# Patient Record
Sex: Male | Born: 1960 | Hispanic: Yes | Marital: Married | Smoking: Former smoker
Health system: Southern US, Community
[De-identification: ages and names within clinical notes are randomized; demographics above are authoritative.]

## PROBLEM LIST (undated history)

## (undated) DIAGNOSIS — I2699 Other pulmonary embolism without acute cor pulmonale: Secondary | ICD-10-CM

## (undated) DIAGNOSIS — L0390 Cellulitis, unspecified: Secondary | ICD-10-CM

## (undated) DIAGNOSIS — R200 Anesthesia of skin: Secondary | ICD-10-CM

## (undated) DIAGNOSIS — I10 Essential (primary) hypertension: Secondary | ICD-10-CM

## (undated) DIAGNOSIS — A4902 Methicillin resistant Staphylococcus aureus infection, unspecified site: Secondary | ICD-10-CM

## (undated) DIAGNOSIS — J90 Pleural effusion, not elsewhere classified: Secondary | ICD-10-CM

## (undated) DIAGNOSIS — R002 Palpitations: Secondary | ICD-10-CM

## (undated) DIAGNOSIS — R0600 Dyspnea, unspecified: Secondary | ICD-10-CM

## (undated) DIAGNOSIS — Z720 Tobacco use: Secondary | ICD-10-CM

## (undated) DIAGNOSIS — A419 Sepsis, unspecified organism: Secondary | ICD-10-CM

## (undated) DIAGNOSIS — M549 Dorsalgia, unspecified: Secondary | ICD-10-CM

---
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
## 2008-06-22 ENCOUNTER — Emergency Department (HOSPITAL_COMMUNITY): Admission: EM | Admit: 2008-06-22 | Discharge: 2008-06-22 | Payer: Self-pay | Admitting: Emergency Medicine

## 2008-06-23 ENCOUNTER — Emergency Department (HOSPITAL_COMMUNITY): Admission: EM | Admit: 2008-06-23 | Discharge: 2008-06-23 | Payer: Self-pay | Admitting: Emergency Medicine

## 2009-04-25 ENCOUNTER — Emergency Department (HOSPITAL_COMMUNITY): Admission: EM | Admit: 2009-04-25 | Discharge: 2009-04-25 | Payer: Self-pay | Admitting: Emergency Medicine

## 2010-12-24 LAB — URINALYSIS, ROUTINE W REFLEX MICROSCOPIC
Leukocytes, UA: NEGATIVE
Nitrite: NEGATIVE
Protein, ur: 30 mg/dL — AB
Urobilinogen, UA: 0.2 mg/dL (ref 0.0–1.0)

## 2010-12-24 LAB — POCT I-STAT, CHEM 8
BUN: 15 mg/dL (ref 6–23)
Creatinine, Ser: 0.9 mg/dL (ref 0.4–1.5)
Potassium: 3.3 mEq/L — ABNORMAL LOW (ref 3.5–5.1)
Sodium: 142 mEq/L (ref 135–145)

## 2010-12-24 LAB — CBC
Hemoglobin: 14.8 g/dL (ref 13.0–17.0)
MCV: 90.7 fL (ref 78.0–100.0)
RBC: 4.62 MIL/uL (ref 4.22–5.81)
WBC: 7.2 10*3/uL (ref 4.0–10.5)

## 2010-12-24 LAB — DIFFERENTIAL
Eosinophils Absolute: 0.1 10*3/uL (ref 0.0–0.7)
Lymphs Abs: 2.1 10*3/uL (ref 0.7–4.0)
Monocytes Relative: 6 % (ref 3–12)
Neutro Abs: 4.5 10*3/uL (ref 1.7–7.7)
Neutrophils Relative %: 63 % (ref 43–77)

## 2010-12-24 LAB — POCT CARDIAC MARKERS
CKMB, poc: <1 ng/mL — ABNORMAL LOW (ref 1.0–8.0)
Myoglobin, poc: 111 ng/mL (ref 12–200)

## 2010-12-24 LAB — URINE MICROSCOPIC-ADD ON

## 2011-06-20 LAB — URINALYSIS, ROUTINE W REFLEX MICROSCOPIC
Bilirubin Urine: NEGATIVE
Glucose, UA: NEGATIVE
Hgb urine dipstick: NEGATIVE
Ketones, ur: NEGATIVE
Nitrite: NEGATIVE
Protein, ur: NEGATIVE
Specific Gravity, Urine: 1.029
Urobilinogen, UA: 0.2
pH: 6

## 2011-06-20 LAB — POCT I-STAT, CHEM 8
BUN: 20
Calcium, Ion: 1.26
Chloride: 103

## 2013-10-26 ENCOUNTER — Ambulatory Visit: Payer: Self-pay | Admitting: Family Medicine

## 2013-10-26 VITALS — BP 136/72 | HR 79 | Temp 97.9°F | Resp 16 | Ht 68.0 in | Wt 204.0 lb

## 2013-10-26 DIAGNOSIS — L0390 Cellulitis, unspecified: Principal | ICD-10-CM

## 2013-10-26 DIAGNOSIS — Z131 Encounter for screening for diabetes mellitus: Secondary | ICD-10-CM

## 2013-10-26 DIAGNOSIS — L0291 Cutaneous abscess, unspecified: Secondary | ICD-10-CM

## 2013-10-26 LAB — POCT CBC
Granulocyte percent: 85.8 %G — AB (ref 37–80)
HCT, POC: 47 % (ref 43.5–53.7)
Hemoglobin: 15 g/dL (ref 14.1–18.1)
Lymph, poc: 1.2 (ref 0.6–3.4)
MCH, POC: 30.4 pg (ref 27–31.2)
MCHC: 31.9 g/dL (ref 31.8–35.4)
MCV: 95.4 fL (ref 80–97)
MID (cbc): 0.6 (ref 0–0.9)
MPV: 9.5 fL (ref 0–99.8)
POC Granulocyte: 10.8 — AB (ref 2–6.9)
POC LYMPH PERCENT: 9.4 %L — AB (ref 10–50)
POC MID %: 4.8 % (ref 0–12)
Platelet Count, POC: 200 10*3/uL (ref 142–424)
RBC: 4.93 M/uL (ref 4.69–6.13)
RDW, POC: 13.3 %
WBC: 12.6 10*3/uL — AB (ref 4.6–10.2)

## 2013-10-26 LAB — POCT GLYCOSYLATED HEMOGLOBIN (HGB A1C): Hemoglobin A1C: 5.5

## 2013-10-26 MED ORDER — DOXYCYCLINE HYCLATE 100 MG PO TABS: 100.0000 mg | ORAL_TABLET | Freq: Two times a day (BID) | ORAL | Status: DC

## 2013-10-26 MED ORDER — CEFTRIAXONE SODIUM 1 G IJ SOLR
1.0000 g | Freq: Once | INTRAMUSCULAR | Status: AC
  Administered 2013-10-26: 1 g via INTRAMUSCULAR

## 2013-10-26 MED ORDER — OXYCODONE-ACETAMINOPHEN 5-325 MG PO TABS: 1.0000 | ORAL_TABLET | Freq: Three times a day (TID) | ORAL | Status: DC | PRN

## 2013-10-26 NOTE — Patient Instructions (Addendum)
Abscess An abscess is an infected area that contains a collection of pus and debris.It can occur in almost any part of the body. An abscess is also known as a furuncle or boil. CAUSES  An abscess occurs when tissue gets infected. This can occur from blockage of oil or sweat glands, infection of hair follicles, or a minor injury to the skin. As the body tries to fight the infection, pus collects in the area and creates pressure under the skin. This pressure causes pain. People with weakened immune systems have difficulty fighting infections and get certain abscesses more often.  SYMPTOMS Usually an abscess develops on the skin and becomes a painful mass that is red, warm, and tender. If the abscess forms under the skin, you may feel a moveable soft area under the skin. Some abscesses break open (rupture) on their own, but most will continue to get worse without care. The infection can spread deeper into the body and eventually into the bloodstream, causing you to feel ill.  DIAGNOSIS  Your caregiver will take your medical history and perform a physical exam. A sample of fluid may also be taken from the abscess to determine what is causing your infection. TREATMENT  Your caregiver may prescribe antibiotic medicines to fight the infection. However, taking antibiotics alone usually does not cure an abscess. Your caregiver may need to make a small cut (incision) in the abscess to drain the pus. In some cases, gauze is packed into the abscess to reduce pain and to continue draining the area. HOME CARE INSTRUCTIONS   Only take over-the-counter or prescription medicines for pain, discomfort, or fever as directed by your caregiver.  If you were prescribed antibiotics, take them as directed. Finish them even if you start to feel better.  If gauze is used, follow your caregiver's directions for changing the gauze.  To avoid spreading the infection:  Keep your draining abscess covered with a  bandage.  Wash your hands well.  Do not share personal care items, towels, or whirlpools with others.  Avoid skin contact with others.  Keep your skin and clothes clean around the abscess.  Keep all follow-up appointments as directed by your caregiver. SEEK MEDICAL CARE IF:   You have increased pain, swelling, redness, fluid drainage, or bleeding.  You have muscle aches, chills, or a general ill feeling.  You have a fever. MAKE SURE YOU:   Understand these instructions.  Will watch your condition.  Will get help right away if you are not doing well or get worse. Document Released: 06/14/2005 Document Revised: 03/05/2012 Document Reviewed: 11/17/2011 [facility] Patient Information 2014 [place]. Absceso (Abscess)  Un absceso (granoo fornculo) es una zona infectada sobre la piel o debajo de la misma. Esta zona se llena de un lquido blanco amarillento (pus) y Psychiatric nurse (residuos).  CUIDADOS EN EL HOGAR  Tome slo la medicacin que Mats Jeanlouis indic el mdico.  Si Sanayah Munro han recetado antibiticos, tmelos segn las indicaciones. Finalice el [place], aunque comience a Sports administrator.  Si Makia Bossi aplicaron una gasa, siga las indicaciones del mdico para [place].  Para evitar la propagacin de la infeccin:  Mantenga el absceso cubierto con el vendaje.  Lvese bien las manos.  No comparta artculos de cuidado personal, toallas o jacuzzis con otros.  Evite el contacto con la piel de Producer, television/film/video.  Mantenga la piel y la ropa limpia alrededor del absceso.  Cumpla con los controles mdicos segn las indicaciones. SOLICITE AYUDA DE INMEDIATO SI:  Elenore Rota  el dolor, el enrojecimiento o la Estate agent de la herida.  Observa lquido o sangre que proviene del sitio de la herida.  Tiene fiebre, escalofros o se siente enfermo.  Tiene fiebre. ASEGRESE DE QUE:   Comprende esas instrucciones para el alta mdica.  Controlar su enfermedad.  Solicitar ayuda  de inmediato si no mejora o empeora. Document Released: 12/01/2008 Document Revised: 03/05/2012 [facility] Patient Information 2014 [place], [place].

## 2013-10-26 NOTE — Progress Notes (Addendum)
Chief Complaint:  Chief Complaint  Patient presents with  . Abscess    to the left inner thigh x's 2 days    HPI: Guy Walker is a 53 y.o. male who is here for  1 week history of left groin cellulitis and groin pain, He initialy had a pimple and then it got worse so he popped it. He also had URI sxs earlier in the week  And was taking ibuprofen and cold and flu meds so if he had fevers or chills they were masked. The pain and skin infection got worse yesterday. He denies fevers at this time. He complains of mainly pain in the area. There was an area that was draining  where he tried to pop the pimple. He denies diabetes. No prior skin infection, has tried some PCN cream ? [employer]. No prior infections, he works in Architect  History reviewed. No pertinent past medical history. History reviewed. No pertinent past surgical history. History   Social History  . Marital Status: Married    Spouse Name: N/A    Number of Children: N/A  . Years of Education: N/A   Social History Main Topics  . Smoking status: Former Smoker    Types: Cigarettes    Quit date: 03/25/2013  . Smokeless tobacco: Never Used  . Alcohol Use: No  . Drug Use: No  . Sexual Activity: None   Other Topics Concern  . None   Social History Narrative  . None   History reviewed. No pertinent family history. No Known Allergies Prior to Admission medications   Not on File     ROS: The patient denies fevers, chills, night sweats, unintentional weight loss, chest pain, palpitations, wheezing, dyspnea on exertion, nausea, vomiting, abdominal pain, dysuria, hematuria, melena, numbness, weakness, or tingling.  All other systems have been reviewed and were otherwise negative with the exception of those mentioned in the HPI and as above.    PHYSICAL EXAM: Filed Vitals:   10/26/13 1324  BP: 136/72  Pulse: 79  Temp: 97.9 F (36.6 C)  Resp: 16   Filed Vitals:   10/26/13 1324  Height: 5'  8" (1.727 m)  Weight: 204 lb (92.534 kg)   Body mass index is 31.03 kg/(m^2).  General: Alert, no acute distress HEENT:  Normocephalic, atraumatic, oropharynx patent. EOMI, PERRLA Cardiovascular:  Regular rate and rhythm, no rubs murmurs or gallops.  No Carotid bruits, radial pulse intact. No pedal edema.  Respiratory: Clear to auscultation bilaterally.  No wheezes, rales, or rhonchi.  No cyanosis, no use of accessory musculature GI: No organomegaly, abdomen is soft and non-tender, positive bowel sounds.  No masses. Skin: + abscess and cellulitis left groin Neurologic: Facial musculature symmetric. Psychiatric: Patient is appropriate throughout our interaction. Lymphatic: No cervical lymphadenopathy Musculoskeletal: Gait intact.   LABS: Results for orders placed in visit on 10/26/13  POCT GLYCOSYLATED HEMOGLOBIN (HGB A1C)      Result Value Range   Hemoglobin A1C 5.5    POCT CBC      Result Value Range   WBC 12.6 (*) 4.6 - 10.2 K/uL   Lymph, poc 1.2  0.6 - 3.4   POC LYMPH PERCENT 9.4 (*) 10 - 50 %L   MID (cbc) 0.6  0 - 0.9   POC MID % 4.8  0 - 12 %M   POC Granulocyte 10.8 (*) 2 - 6.9   Granulocyte percent 85.8 (*) 37 - 80 %G   RBC 4.93  4.69 -  6.13 M/uL   Hemoglobin 15.0  14.1 - 18.1 g/dL   HCT, POC 47.0  43.5 - 53.7 %   MCV 95.4  80 - 97 fL   MCH, POC 30.4  27 - 31.2 pg   MCHC 31.9  31.8 - 35.4 g/dL   RDW, POC 13.3     Platelet Count, POC 200  142 - 424 K/uL   MPV 9.5  0 - 99.8 fL     EKG/XRAY:   Primary read interpreted by Dr. Marin Comment at [facility].   ASSESSMENT/PLAN: Encounter Diagnoses  Name Primary?  . Cellulitis and abscess Yes  . Screening for diabetes mellitus    Rocephin 500 mg x 2 fiven since we ran out of 1 gram Rocephin I& D some but not all Rx for Doxycycline was given Rx percocet prn for pain F/u in Am  Gross sideeffects, risk and benefits, and alternatives of medications d/w patient. Patient is aware that all medications have potential sideeffects and we  are unable to predict every sideeffect or drug-drug interaction that may occur.  LE, Harts, DO 10/26/2013 3:00 PM

## 2013-10-26 NOTE — Progress Notes (Signed)
Procedure Note: Verbal consent obtained.  Local anesthesia with 3 cc 2% lidocaine.  Betadine prep.  Incision with 11 blade.  Copious purulence and blood expressed.  Wound explored with curved hemostats.  Wound irrigated with remaining anesthetic.  Packed with 1/2 inch plain packing.  Cleansed and dressed.  Discussed wound care.  Pt to RTC tomorrow for recheck.

## 2013-10-27 ENCOUNTER — Inpatient Hospital Stay (HOSPITAL_COMMUNITY)
Admission: EM | Admit: 2013-10-27 | Discharge: 2013-11-02 | DRG: 872 | Disposition: A | Discharge status: Home / Self Care | Payer: Self-pay | Attending: Internal Medicine | Admitting: Internal Medicine

## 2013-10-27 ENCOUNTER — Encounter (HOSPITAL_COMMUNITY): Payer: Self-pay | Admitting: Emergency Medicine

## 2013-10-27 ENCOUNTER — Ambulatory Visit (INDEPENDENT_AMBULATORY_CARE_PROVIDER_SITE_OTHER): Payer: Self-pay | Admitting: Physician Assistant

## 2013-10-27 VITALS — BP 126/78 | HR 91 | Temp 98.4°F | Resp 17 | Ht 69.0 in | Wt 206.0 lb

## 2013-10-27 DIAGNOSIS — B9562 Methicillin resistant Staphylococcus aureus infection as the cause of diseases classified elsewhere: Secondary | ICD-10-CM

## 2013-10-27 DIAGNOSIS — A4902 Methicillin resistant Staphylococcus aureus infection, unspecified site: Secondary | ICD-10-CM | POA: Diagnosis present

## 2013-10-27 DIAGNOSIS — IMO0002 Reserved for concepts with insufficient information to code with codable children: Secondary | ICD-10-CM | POA: Diagnosis present

## 2013-10-27 DIAGNOSIS — D72829 Elevated white blood cell count, unspecified: Secondary | ICD-10-CM

## 2013-10-27 DIAGNOSIS — Z6829 Body mass index (BMI) 29.0-29.9, adult: Secondary | ICD-10-CM

## 2013-10-27 DIAGNOSIS — Z87891 Personal history of nicotine dependence: Secondary | ICD-10-CM

## 2013-10-27 DIAGNOSIS — R000 Tachycardia, unspecified: Secondary | ICD-10-CM | POA: Diagnosis present

## 2013-10-27 DIAGNOSIS — L0390 Cellulitis, unspecified: Principal | ICD-10-CM

## 2013-10-27 DIAGNOSIS — L03119 Cellulitis of unspecified part of limb: Secondary | ICD-10-CM

## 2013-10-27 DIAGNOSIS — L03319 Cellulitis of trunk, unspecified: Secondary | ICD-10-CM

## 2013-10-27 DIAGNOSIS — L0291 Cutaneous abscess, unspecified: Secondary | ICD-10-CM

## 2013-10-27 DIAGNOSIS — L02419 Cutaneous abscess of limb, unspecified: Secondary | ICD-10-CM | POA: Diagnosis present

## 2013-10-27 DIAGNOSIS — A419 Sepsis, unspecified organism: Principal | ICD-10-CM | POA: Diagnosis present

## 2013-10-27 DIAGNOSIS — L02219 Cutaneous abscess of trunk, unspecified: Secondary | ICD-10-CM | POA: Diagnosis present

## 2013-10-27 DIAGNOSIS — Z131 Encounter for screening for diabetes mellitus: Secondary | ICD-10-CM

## 2013-10-27 LAB — POCT CBC
GRANULOCYTE PERCENT: 86.9 % — AB (ref 37–80)
HEMATOCRIT: 45.2 % (ref 43.5–53.7)
HEMOGLOBIN: 14.1 g/dL (ref 14.1–18.1)
Lymph, poc: 1.3 (ref 0.6–3.4)
MCH, POC: 29.9 pg (ref 27–31.2)
MCHC: 31.2 g/dL — AB (ref 31.8–35.4)
MCV: 96 fL (ref 80–97)
MID (cbc): 0.6 (ref 0–0.9)
MPV: 9.1 fL (ref 0–99.8)
POC GRANULOCYTE: 12.9 — AB (ref 2–6.9)
POC LYMPH PERCENT: 9 %L — AB (ref 10–50)
POC MID %: 4.1 %M (ref 0–12)
Platelet Count, POC: 201 10*3/uL (ref 142–424)
RBC: 4.71 M/uL (ref 4.69–6.13)
RDW, POC: 13.9 %
WBC: 14.8 10*3/uL — AB (ref 4.6–10.2)

## 2013-10-27 LAB — CBC WITH DIFFERENTIAL/PLATELET
Basophils Absolute: 0 10*3/uL (ref 0.0–0.1)
Basophils Relative: 0 % (ref 0–1)
EOS PCT: 1 % (ref 0–5)
Eosinophils Absolute: 0.1 10*3/uL (ref 0.0–0.7)
HEMATOCRIT: 43.5 % (ref 39.0–52.0)
HEMOGLOBIN: 14.9 g/dL (ref 13.0–17.0)
LYMPHS ABS: 1.4 10*3/uL (ref 0.7–4.0)
LYMPHS PCT: 10 % — AB (ref 12–46)
MCH: 30.8 pg (ref 26.0–34.0)
MCHC: 34.3 g/dL (ref 30.0–36.0)
MCV: 89.9 fL (ref 78.0–100.0)
MONO ABS: 1 10*3/uL (ref 0.1–1.0)
MONOS PCT: 8 % (ref 3–12)
Neutro Abs: 11 10*3/uL — ABNORMAL HIGH (ref 1.7–7.7)
Neutrophils Relative %: 81 % — ABNORMAL HIGH (ref 43–77)
PLATELETS: 193 10*3/uL (ref 150–400)
RBC: 4.84 MIL/uL (ref 4.22–5.81)
RDW: 12.7 % (ref 11.5–15.5)
WBC: 13.5 10*3/uL — AB (ref 4.0–10.5)

## 2013-10-27 LAB — COMPREHENSIVE METABOLIC PANEL
ALK PHOS: 111 U/L (ref 39–117)
ALT: 32 U/L (ref 0–53)
AST: 22 U/L (ref 0–37)
Albumin: 3.2 g/dL — ABNORMAL LOW (ref 3.5–5.2)
BUN: 13 mg/dL (ref 6–23)
CALCIUM: 9 mg/dL (ref 8.4–10.5)
CO2: 23 meq/L (ref 19–32)
Chloride: 103 mEq/L (ref 96–112)
Creatinine, Ser: 0.81 mg/dL (ref 0.50–1.35)
GLUCOSE: 127 mg/dL — AB (ref 70–99)
Potassium: 4 mEq/L (ref 3.7–5.3)
SODIUM: 138 meq/L (ref 137–147)
Total Bilirubin: 0.5 mg/dL (ref 0.3–1.2)
Total Protein: 7 g/dL (ref 6.0–8.3)

## 2013-10-27 MED ORDER — MORPHINE SULFATE 2 MG/ML IJ SOLN
2.0000 mg | INTRAMUSCULAR | Status: DC | PRN
  Administered 2013-10-27 – 2013-10-28 (×3): 2 mg via INTRAVENOUS
  Filled 2013-10-27 (×3): qty 1

## 2013-10-27 MED ORDER — ACETAMINOPHEN 325 MG PO TABS
650.0000 mg | ORAL_TABLET | Freq: Four times a day (QID) | ORAL | Status: DC | PRN
  Administered 2013-10-27 – 2013-10-30 (×3): 650 mg via ORAL
  Filled 2013-10-27 (×3): qty 2

## 2013-10-27 MED ORDER — VANCOMYCIN HCL IN DEXTROSE 1-5 GM/200ML-% IV SOLN
1000.0000 mg | Freq: Once | INTRAVENOUS | Status: AC
  Administered 2013-10-27: 1000 mg via INTRAVENOUS
  Filled 2013-10-27: qty 200

## 2013-10-27 MED ORDER — ACETAMINOPHEN 650 MG RE SUPP: 650.0000 mg | Freq: Four times a day (QID) | RECTAL | Status: DC | PRN

## 2013-10-27 MED ORDER — VANCOMYCIN HCL IN DEXTROSE 1-5 GM/200ML-% IV SOLN
1000.0000 mg | Freq: Three times a day (TID) | INTRAVENOUS | Status: DC
  Administered 2013-10-27 – 2013-10-28 (×4): 1000 mg via INTRAVENOUS
  Filled 2013-10-27 (×5): qty 200

## 2013-10-27 MED ORDER — HEPARIN SODIUM (PORCINE) 5000 UNIT/ML IJ SOLN
5000.0000 [IU] | Freq: Three times a day (TID) | INTRAMUSCULAR | Status: DC
  Administered 2013-10-28 – 2013-10-29 (×6): 5000 [IU] via SUBCUTANEOUS
  Filled 2013-10-27 (×11): qty 1

## 2013-10-27 MED ORDER — HYDROMORPHONE HCL PF 1 MG/ML IJ SOLN
1.0000 mg | Freq: Once | INTRAMUSCULAR | Status: AC
  Administered 2013-10-27: 1 mg via INTRAVENOUS
  Filled 2013-10-27: qty 1

## 2013-10-27 MED ORDER — SODIUM CHLORIDE 0.9 % IV BOLUS (SEPSIS)
1000.0000 mL | Freq: Once | INTRAVENOUS | Status: AC
  Administered 2013-10-27: 1000 mL via INTRAVENOUS

## 2013-10-27 NOTE — ED Notes (Signed)
Attempted to call report to floor 

## 2013-10-27 NOTE — ED Provider Notes (Signed)
CSN: [id number]     Arrival date & time 10/27/13  1034 History   First MD Initiated Contact with Patient 10/27/13 1152     Chief Complaint  Patient presents with  . Leg Pain      HPI  Patient presents with concern of increasing size and pain about the right medial thigh abscess. Patient had incision and drainage performed yesterday at another facility. The procedure was well-tolerated, he went home on doxycycline. Over the interval today he has had increasing size, pain in the area. There is no objective fever, nausea, vomiting, distress, belly pain. No scrotal changes, no hematuria, dysuria. No distal dysesthesia or weakness.   History reviewed. No pertinent past medical history. History reviewed. No pertinent past surgical history. History reviewed. No pertinent family history. History  Substance Use Topics  . Smoking status: Former Smoker    Types: Cigarettes    Quit date: 03/25/2013  . Smokeless tobacco: Never Used  . Alcohol Use: No    Review of Systems  Constitutional:       Per HPI, otherwise negative  HENT:       Per HPI, otherwise negative  Respiratory:       Per HPI, otherwise negative  Cardiovascular:       Per HPI, otherwise negative  Gastrointestinal: Negative for nausea and vomiting.  Endocrine:       Negative aside from HPI  Genitourinary:       Neg aside from HPI   Musculoskeletal:       Per HPI, otherwise negative  Skin: Positive for wound.  Neurological: Negative for syncope.      Allergies  Review of patient's allergies indicates no known allergies.  Home Medications   Current Outpatient Rx  Name  Route  Sig  Dispense  Refill  . doxycycline (VIBRA-TABS) 100 MG tablet   Oral   Take 1 tablet (100 mg total) by mouth 2 (two) times daily.   20 tablet   0   . oxyCODONE-acetaminophen (ROXICET) 5-325 MG per tablet   Oral   Take 1 tablet by mouth every 8 (eight) hours as needed for severe pain.   20 tablet   0    BP 160/72  Pulse 102   Temp(Src) 97.7 F (36.5 C) (Oral)  Resp 18  SpO2 94% Physical Exam  Nursing note and vitals reviewed. Constitutional: He is oriented to person, place, and time. He appears well-developed. No distress.  HENT:  Head: Normocephalic and atraumatic.  Eyes: Conjunctivae and EOM are normal.  Cardiovascular: Normal rate and regular rhythm.   Pulmonary/Chest: Effort normal. No stridor. No respiratory distress.  Abdominal: He exhibits no distension.  Genitourinary:     Musculoskeletal: He exhibits no edema.  Neurological: He is alert and oriented to person, place, and time.  Skin: Skin is warm and dry.  Psychiatric: He has a normal mood and affect.    ED Course  Procedures (including critical care time) Labs Review Labs Reviewed  CBC WITH DIFFERENTIAL - Abnormal; Notable for the following:    WBC 13.5 (*)    Neutrophils Relative % 81 (*)    Neutro Abs 11.0 (*)    Lymphocytes Relative 10 (*)    All other components within normal limits  COMPREHENSIVE METABOLIC PANEL   Imaging Review No results found.  EKG Interpretation   None       MDM   Final diagnoses:  None    Patient presents one day after incision and drainage of a  left thigh abscess with increasing pain, erythema, induration concern first colitis.  Patient is afebrile, hemodynamically stable, but with leukocytosis, cellulitis will receive IV antibiotics, be admitted for further evaluation and management.    Carmin Muskrat, MD 10/27/13 1308

## 2013-10-27 NOTE — ED Notes (Signed)
Initial contact - pt reports abscess to R upper leg and seen here yesterday with packing placed, pt reports f/u appt this AM at PCP office "it looks worse" and told to come back to ER.  Pt denies new complaints.  Otherwise skin PWD.  MAEI.  Exam deferred at this time 2/2 patient placement in hallway bed.  NAD.  Awaiting EDP eval.

## 2013-10-27 NOTE — Progress Notes (Signed)
   Subjective:    Patient ID: Guy Walker, male    DOB: November 30, 1960, 53 y.o.   MRN: [id number]  HPI Pt presents to clinic for recheck.  He has been tolerating his abx and pain medication ok.  He took both this am.  The drsg had a lot of green and yellow and gray drainage on it.  The area really hurts and the redness is out of the marked area.  Last pm he felt bad and had a fever.  Review of Systems  Constitutional: Positive for fever (subjective) and chills.  Gastrointestinal: Negative for nausea.  Skin: Positive for rash.       Objective:   Physical Exam  Vitals reviewed. Constitutional: He appears well-developed and well-nourished.  HENT:  Head: Normocephalic and atraumatic.  Right Ear: External ear normal.  Left Ear: External ear normal.  Pulmonary/Chest: Effort normal.  Skin: There is erythema.  Left groin - erythema and induration and TTP - erythema is out of marked area from yesterday.  Packing was removed and no purulence expressed.  There is no fluctuance around the incision.  Psychiatric: He has a normal mood and affect. His behavior is normal. Judgment and thought content normal.    Results for orders placed in visit on 10/27/13  POCT CBC      Result Value Range   WBC 14.8 (*) 4.6 - 10.2 K/uL   Lymph, poc 1.3  0.6 - 3.4   POC LYMPH PERCENT 9.0 (*) 10 - 50 %L   MID (cbc) 0.6  0 - 0.9   POC MID % 4.1  0 - 12 %M   POC Granulocyte 12.9 (*) 2 - 6.9   Granulocyte percent 86.9 (*) 37 - 80 %G   RBC 4.71  4.69 - 6.13 M/uL   Hemoglobin 14.1  14.1 - 18.1 g/dL   HCT, POC 45.2  43.5 - 53.7 %   MCV 96.0  80 - 97 fL   MCH, POC 29.9  27 - 31.2 pg   MCHC 31.2 (*) 31.8 - 35.4 g/dL   RDW, POC 13.9     Platelet Count, POC 201  142 - 424 K/uL   MPV 9.1  0 - 99.8 fL       Assessment & Plan:  Cellulitis and abscess - Plan: POCT CBC  Leukocytosis, unspecified  Pt is worse - and due to size of the cellulitis and his worsening white count we will send him to the ED  for IV antibiotics and possible surgical consult due to possible loculations within the abscess/cellulitis.  Windell Hummingbird PA-C 10/27/2013 10:03 AM

## 2013-10-27 NOTE — Progress Notes (Signed)
Patient needs Spanish Interpreter

## 2013-10-27 NOTE — H&P (Signed)
[facility] History and Physical  Arish Redner UEA:[id number] DOB: 11-16-1960 DOA: 10/27/2013  Referring physician: Emergency Department PCP: No primary provider on file.  Specialists:   Chief Complaint: Cellulitis  HPI: Guy Walker is a 53 y.o. male  With no significant PMH who presents with worsening cellulitis of the pelvic region associated with fevers. Sx started about 4 days ago. Pt was seen in urgent care center one day ago with I/d done. Overnight, cellulitis worsened. Pt was otherwise continued on doxy as an outpatient. In the ED, the pt was noted to have a wbc of 13K and was mildly tachycardic. The patient was started on vanc and hospitalist consulted for consideration for admission.  Review of Systems:  Per above, the remainder of the 10pt ros reviewed and are neg  History reviewed. No pertinent past medical history. History reviewed. No pertinent past surgical history. Social History:  reports that he quit smoking about 7 months ago. His smoking use included Cigarettes. He smoked 0.00 packs per day. He has never used smokeless tobacco. He reports that he does not drink alcohol or use illicit drugs.   No Known Allergies  History reviewed. No pertinent family history.  (be sure to complete)  Prior to Admission medications   Medication Sig Start Date End Date Taking? Authorizing Provider  doxycycline (VIBRA-TABS) 100 MG tablet Take 1 tablet (100 mg total) by mouth 2 (two) times daily. 10/26/13  Yes Thao P Le, DO  oxyCODONE-acetaminophen (ROXICET) 5-325 MG per tablet Take 1 tablet by mouth every 8 (eight) hours as needed for severe pain. 10/26/13  Yes Thao P Le, DO   Physical Exam: Filed Vitals:   10/27/13 1046  BP: 160/72  Pulse: 102  Temp: 97.7 F (36.5 C)  TempSrc: Oral  Resp: 18  SpO2: 94%     General:  Awake, in nad  Eyes: PERRL B  ENT: membranes moist, dentition fair  Neck: trachea midline, neck supple  Cardiovascular:  regular, s1, s2  Respiratory: normal resp effort, no wheezing  Abdomen: soft, nondistnede  Skin: normal skin turgor, patch of erythema over L lower pelvis with outline drawn  Musculoskeletal: perfused, no clubbing  Psychiatric: mood/affect normal // no auditory/visual hallucinations  Neurologic: cn2-12 grossly intact, strength/sensation intact  Labs on Admission:  Basic Metabolic Panel:  Recent Labs Lab 10/27/13 1235  NA 138  K 4.0  CL 103  CO2 23  GLUCOSE 127*  BUN 13  CREATININE 0.81  CALCIUM 9.0   Liver Function Tests:  Recent Labs Lab 10/27/13 1235  AST 22  ALT 32  ALKPHOS 111  BILITOT 0.5  PROT 7.0  ALBUMIN 3.2*   No results found for this basename: LIPASE, AMYLASE,  in the last 168 hours No results found for this basename: AMMONIA,  in the last 168 hours CBC:  Recent Labs Lab 10/26/13 1442 10/27/13 0926 10/27/13 1235  WBC 12.6* 14.8* 13.5*  NEUTROABS  --   --  11.0*  HGB 15.0 14.1 14.9  HCT 47.0 45.2 43.5  MCV 95.4 96.0 89.9  PLT  --   --  193   Cardiac Enzymes: No results found for this basename: CKTOTAL, CKMB, CKMBINDEX, TROPONINI,  in the last 168 hours  BNP (last 3 results) No results found for this basename: PROBNP,  in the last 8760 hours CBG: No results found for this basename: GLUCAP,  in the last 168 hours  Radiological Exams on Admission: No results found.  Assessment/Plan Principal Problem:   Sepsis Active Problems:  Cellulitis and abscess   1. Sepsis with cellulitis 1. Will follow blood cx 2. Cont on empiric vanc 3. Tylenol for fevers 4. Admit to med-surg 2. DVT prophylaxis 1. Heparin subQ   Code Status: Full (must indicate code status--if unknown or must be presumed, indicate so) Family Communication: Pt in room (indicate person spoken with, if applicable, with phone number if by telephone) Disposition Plan: Pending (indicate anticipated LOS)  Time spent: 9min  Sherard Sutch, [facility] Pager  [phone]  If 7PM-7AM, please contact night-coverage [email or web address] Password [email or web address] 10/27/2013, 1:40 PM

## 2013-10-27 NOTE — Progress Notes (Signed)
[facility] provided pt with a [facility] card application to help establish primary care.

## 2013-10-27 NOTE — Progress Notes (Signed)
UR completed 

## 2013-10-27 NOTE — Progress Notes (Signed)
ANTIBIOTIC CONSULT NOTE - INITIAL  Pharmacy Consult for Vancomycin Indication: Cellulitis  No Known Allergies  Patient Measurements:   Adjusted Body Weight:   Vital Signs: Temp: 99.7 F (37.6 C) (02/09 1415) Temp src: Oral (02/09 1415) BP: 159/75 mmHg (02/09 1415) Pulse Rate: 91 (02/09 1415) Intake/Output from previous day:   Intake/Output from this shift:    Labs:  Recent Labs  10/26/13 1442 10/27/13 0926 10/27/13 1235  WBC 12.6* 14.8* 13.5*  HGB 15.0 14.1 14.9  PLT  --   --  193  CREATININE  --   --  0.81   The CrCl is unknown because both a height and weight (above a minimum accepted value) are required for this calculation. No results found for this basename: VANCOTROUGH, Corlis Leak, VANCORANDOM, GENTTROUGH, GENTPEAK, GENTRANDOM, TOBRATROUGH, TOBRAPEAK, TOBRARND, AMIKACINPEAK, AMIKACINTROU, AMIKACIN,  in the last 72 hours   Microbiology: Recent Results (from the past 720 hour(s))  WOUND CULTURE     Status: None   Collection Time    10/26/13  2:34 PM      Result Value Range Status   Preliminary Report Culture reincubated for better growth   Preliminary    Medical History: History reviewed. No pertinent past medical history.  Assessment:  51 yoM with worsening cellulitis of pelvic region associated with fevers, s/p I&D 2/8.   Pharmacy consulted to dose vancomycin for cellulitis.  Already received Vanc 1g x 1 in ED at 1300.    NKDA Wt 93.4kg  Antibiotics 2/8 >> Doxycycline 2/9 (outpatient) 2/8 >> Ceftriaxone x 1 (urgent care) 2/9 >> Vancomycin  >>  Tmax: 99.7 WBCs:13.5 Renal: SCr 0.81, CrCl >100 (CG and N)  2/8 wound:  reincubated 2/9 wound: ordered 2/9 blood: ordered  Goal of Therapy:  Vancomycin trough level 10-15 mcg/ml  Plan:  Vancomycin 1000mg  IV q 8 hours.  Monitor renal function and vancomycin trough at Css as appropriate.   Thank you for this consult.   Ralene Bathe, PharmD, BCPS 10/27/2013, 2:45 PM  Pager: [phone]

## 2013-10-27 NOTE — Progress Notes (Signed)
No orders for wound care noted.  Called and spoke with Melody from [facility] for suggestions prior to contacting MD.  She suggested 1/4inch iodoform gauze for gentle/light packing leaving a wick and covering with dry gauze. Previous dressing of moist to dry gauze not recommended to avoid maceration of surrounding tissue.  Measured depth of incision site in all directions, between 1.5-2cm depth with no tunneling observed.  Paged Dr. Wyline Copas to notify, will await orders.  Coolidge Breeze, RN 10/27/2013

## 2013-10-27 NOTE — ED Notes (Signed)
Pt states he was here yesterday for I and D of R upper leg abscess. Pt went to PCP for wound check and removal of packing, was told it looked worse and was told to come here for abx.

## 2013-10-27 NOTE — Consult Note (Signed)
WOC wound consult note Reason for Consult:Site of previous I&D draining and surrounding skin erythematous.  Patient speaks Spanish and requires an interpreter.  Wife with patient and does not speak [place].  Daughter speaks English and will not be here tomorrow. Wound type:infectious Pressure Ulcer POA: No Measurement:1.5cm round with 1.5cm depth. Wound AJO:INOMVE to see; serosanguinous exudate Drainage (amount, consistency, odor) As above.  No odor. Periwound:Indurated and with erythema to left knee Dressing procedure/placement/frequency:I will continue the wound cleansing with NS, pat dry.  Fill defect with 1/4 inch iodoform gauze packing strip and top with two folded 4x4s.  Use paper tape (minimally) to secure as patient is very sensitive in the periwound area.  Recommend surgical consult for evaluation of wound.  If you agree, please initiate consult request. [facility] nursing team will not follow, but will remain available to this patient, the nursing, surgical and medical teams.  Please re-consult if needed. Thanks, Maudie Flakes, MSN, RN, [place], [place], [place] [phone])

## 2013-10-27 NOTE — Patient Instructions (Signed)
[address], [place], [place] [address] [phone]

## 2013-10-28 LAB — COMPREHENSIVE METABOLIC PANEL
ALK PHOS: 101 U/L (ref 39–117)
ALT: 25 U/L (ref 0–53)
AST: 16 U/L (ref 0–37)
Albumin: 2.7 g/dL — ABNORMAL LOW (ref 3.5–5.2)
BUN: 11 mg/dL (ref 6–23)
CALCIUM: 8.1 mg/dL — AB (ref 8.4–10.5)
CO2: 22 mEq/L (ref 19–32)
Chloride: 101 mEq/L (ref 96–112)
Creatinine, Ser: 0.78 mg/dL (ref 0.50–1.35)
GFR calc Af Amer: >90 mL/min (ref >90)
GFR calc non Af Amer: >90 mL/min (ref >90)
GLUCOSE: 116 mg/dL — AB (ref 70–99)
POTASSIUM: 3.8 meq/L (ref 3.7–5.3)
SODIUM: 135 meq/L — AB (ref 137–147)
TOTAL PROTEIN: 6.1 g/dL (ref 6.0–8.3)
Total Bilirubin: 0.4 mg/dL (ref 0.3–1.2)

## 2013-10-28 LAB — CBC
HEMATOCRIT: 39.2 % (ref 39.0–52.0)
Hemoglobin: 13.5 g/dL (ref 13.0–17.0)
MCH: 30.4 pg (ref 26.0–34.0)
MCHC: 34.4 g/dL (ref 30.0–36.0)
MCV: 88.3 fL (ref 78.0–100.0)
Platelets: 203 10*3/uL (ref 150–400)
RBC: 4.44 MIL/uL (ref 4.22–5.81)
RDW: 12.6 % (ref 11.5–15.5)
WBC: 11.5 10*3/uL — ABNORMAL HIGH (ref 4.0–10.5)

## 2013-10-28 LAB — VANCOMYCIN, TROUGH: Vancomycin Tr: 7.1 ug/mL — ABNORMAL LOW (ref 10.0–20.0)

## 2013-10-28 MED ORDER — VANCOMYCIN HCL 10 G IV SOLR
1250.0000 mg | Freq: Three times a day (TID) | INTRAVENOUS | Status: DC
  Administered 2013-10-29 – 2013-11-02 (×14): 1250 mg via INTRAVENOUS
  Filled 2013-10-28 (×15): qty 1250

## 2013-10-28 MED ORDER — OXYCODONE-ACETAMINOPHEN 5-325 MG PO TABS
1.0000 | ORAL_TABLET | ORAL | Status: DC | PRN
  Administered 2013-10-28 – 2013-10-30 (×4): 2 via ORAL
  Administered 2013-10-30: 1 via ORAL
  Administered 2013-10-31 – 2013-11-02 (×3): 2 via ORAL
  Filled 2013-10-28 (×7): qty 2
  Filled 2013-10-28: qty 1

## 2013-10-28 MED ORDER — MORPHINE SULFATE 2 MG/ML IJ SOLN
2.0000 mg | INTRAMUSCULAR | Status: DC | PRN
  Administered 2013-10-30 – 2013-10-31 (×5): 2 mg via INTRAVENOUS
  Filled 2013-10-28 (×5): qty 1

## 2013-10-28 NOTE — Progress Notes (Signed)
Dressing to L lower pelvis/upper inner thigh changed at 1100AM.  Peri wound redness appears to be increased since yesterday 10/27/13 upon admission (this RN admitted patient).  Redness increased about 1 cm toward knee and appears darker and more indurated immediately surrounding incision site.    1645pm - Redness extending toward knee noted to be further increased by 1 additional cm. Paged Dr. Doyle Askew this afternoon to discuss, new surgery consult ordered.  Also discussed with Maudie Flakes, [place] RN, who agrees with course of action.  Will continue to monitor and assess. Coolidge Breeze, RN 10/28/2013

## 2013-10-28 NOTE — Progress Notes (Signed)
ANTIBIOTIC CONSULT NOTE - FOLLOW UP  Pharmacy Consult for Vancomycin Indication: Cellulitis  No Known Allergies  Patient Measurements: Height: 5' 8.9" (175 cm) Weight: 205 lb 14.6 oz (93.4 kg) IBW/kg (Calculated) : 70.47 Adjusted Body Weight:   Vital Signs: Temp: 99.2 F (37.3 C) (02/10 2123) Temp src: Oral (02/10 2123) BP: 131/74 mmHg (02/10 2123) Pulse Rate: 73 (02/10 2123) Intake/Output from previous day: 02/09 0701 - 02/10 0700 In: -  Out: 450 [Urine:450] Intake/Output from this shift:    Labs:  Recent Labs  10/27/13 0926 10/27/13 1235 10/28/13 0505  WBC 14.8* 13.5* 11.5*  HGB 14.1 14.9 13.5  PLT  --  193 203  CREATININE  --  0.81 0.78   Estimated Creatinine Clearance: 124.5 ml/min (by C-G formula based on Cr of 0.78).  Recent Labs  10/28/13 2030  [place] 7.1*     Microbiology: Recent Results (from the past 720 hour(s))  WOUND CULTURE     Status: None   Collection Time    10/26/13  2:34 PM      Result Value Range Status   Gram Stain No WBC Seen   Preliminary   Gram Stain No Squamous Epithelial Cells Seen   Preliminary   Gram Stain Few GRAM POSITIVE COCCI IN PAIRS   Preliminary   Preliminary Report Abundant STAPHYLOCOCCUS AUREUS   Preliminary   Comment: Rifampin and Gentamicin should not be used as     single drugs for treatment of Staph infections.  WOUND CULTURE     Status: None   Collection Time    10/27/13  2:47 PM      Result Value Range Status   Specimen Description WOUND LEFT GOIN   Final   Special Requests NONE   Final   Gram Stain     Final   Value: ABUNDANT WBC PRESENT,BOTH PMN AND MONONUCLEAR     NO SQUAMOUS EPITHELIAL CELLS SEEN     MODERATE GRAM POSITIVE COCCI IN CLUSTERS     Performed at [facility]   Culture     Final   Value: Culture reincubated for better growth     Performed at [facility]   Report Status PENDING   Incomplete  CULTURE, BLOOD (ROUTINE X 2)     Status: None   Collection Time   10/27/13  3:00 PM      Result Value Range Status   Specimen Description BLOOD LEFT ANTECUBITAL   Final   Special Requests BOTTLES DRAWN AEROBIC AND ANAEROBIC 5ML   Final   Culture  Setup Time     Final   Value: 10/27/2013 20:02     Performed at [facility]   Culture     Final   Value:        BLOOD CULTURE RECEIVED NO GROWTH TO DATE CULTURE WILL BE HELD FOR 5 DAYS BEFORE ISSUING A FINAL NEGATIVE REPORT     Performed at [facility]   Report Status PENDING   Incomplete  CULTURE, BLOOD (ROUTINE X 2)     Status: None   Collection Time    10/27/13  3:15 PM      Result Value Range Status   Specimen Description BLOOD RIGHT HAND   Final   Special Requests BOTTLES DRAWN AEROBIC AND ANAEROBIC 5ML   Final   Culture  Setup Time     Final   Value: 10/27/2013 20:02     Performed at [facility]  Final   Value:        BLOOD CULTURE RECEIVED NO GROWTH TO DATE CULTURE WILL BE HELD FOR 5 DAYS BEFORE ISSUING A FINAL NEGATIVE REPORT     Performed at [facility]   Report Status PENDING   Incomplete    Medical History: History reviewed. No pertinent past medical history.  Assessment:  39 yoM with worsening cellulitis of pelvic region associated with fevers, s/p I&D 2/8.   Pharmacy consulted to dose vancomycin for cellulitis.    NKDA Wt 93.4kg  Antibiotics 2/8 >> Doxycycline 2/9 (outpatient) 2/8 >> Ceftriaxone x 1 (urgent care) 2/9 >> Vancomycin  >>  Tmax: 99.2 WBCs:11.5 Renal: SCr 0.78, CrCl >100 (CG and N)  2/8 wound:  reincubated 2/9 wound: ordered 2/9 blood: ordered  Vancomycin trough level = 7.1 mcg/ml on Vancomycin 1gm IV q8h (Vanc 1 gm dose last given @ 20:45 tonight)  Goal of Therapy:  Vancomycin trough level 10-15 mcg/ml  Plan:  Increase Vancomycin to 1250mg  IV q8h with next dose  Leone Haven, PharmD  10/28/2013, 9:52 PM

## 2013-10-28 NOTE — Progress Notes (Addendum)
Patient ID: Guy Walker, male   DOB: [birth year], 53 y.o.   MRN: [id number] [facility] PROGRESS NOTE  Guy Walker [id number] DOB: 15-Aug-1961 DOA: 10/27/2013 PCP: No primary provider on file.  Brief narrative: 53 y.o. Male with no significant PMH who presented with worsening cellulitis of the pelvic region extending to the left thigh area and also associated with fevers. Sx started about 4 days prior to this admission. Pt was seen in urgent care center and required I&D and discharged on doxycycline. Overnight, cellulitis worsened. In the ED, the pt was noted to have a wbc of 13K and was mildly tachycardic. The patient was started on vanc and hospitalist consulted for consideration for admission.  Principal Problem:   Sepsis - secondary to right medial thigh abscess and subsequent cellulitis in the pelvic area and extending to the left medical thigh area - per nursing staff and wound care specialist appears to be getting worse  - preliminary wound cultures with gram + cocci in clusters - WBC trending down - continue vancomycin day #2 - will consult ortho team for further assistance  Active Problems:   Cellulitis and abscess - management as noted above - Vancomycin day #2  Consultants:  None  Procedures/Studies:  None   Antibiotics:  Vancomycin 02/09 -->   Code Status: Full Family Communication: Pt at bedside Disposition Plan: Home when medically stable  HPI/Subjective: No events overnight.   Objective: Filed Vitals:   10/27/13 1747 10/27/13 2105 10/28/13 0620 10/28/13 1521  BP:  133/72 133/78 133/76  Pulse:  85 79 70  Temp: 100.3 F (37.9 C) 99.2 F (37.3 C) 99.6 F (37.6 C) 98.9 F (37.2 C)  TempSrc: Oral Oral Oral Oral  Resp:  21 18 16   Height:      Weight:      SpO2:  95% 94% 98%    Intake/Output Summary (Last 24 hours) at 10/28/13 1628 Last data filed at 10/28/13 1522  Gross per 24 hour  Intake    480 ml  Output    1675 ml  Net  -1195 ml    Exam:   General:  Pt is alert, follows commands appropriately, not in acute distress  Cardiovascular: Regular rate and rhythm, S1/S2, no murmurs, no rubs, no gallops  Respiratory: Clear to auscultation bilaterally, no wheezing, no crackles, no rhonchi  Abdomen: Soft, non tender, non distended, bowel sounds present, no guarding  Extremities: No edema, pulses DP and PT palpable bilaterally, erythema and TTP in the left thigh area, worse compared to yesterday   Neuro: Grossly nonfocal  Data Reviewed: Basic Metabolic Panel:  Recent Labs Lab 10/27/13 1235 10/28/13 0505  NA 138 135*  K 4.0 3.8  CL 103 101  CO2 23 22  GLUCOSE 127* 116*  BUN 13 11  CREATININE 0.81 0.78  CALCIUM 9.0 8.1*   Liver Function Tests:  Recent Labs Lab 10/27/13 1235 10/28/13 0505  AST 22 16  ALT 32 25  ALKPHOS 111 101  BILITOT 0.5 0.4  PROT 7.0 6.1  ALBUMIN 3.2* 2.7*   CBC:  Recent Labs Lab 10/26/13 1442 10/27/13 0926 10/27/13 1235 10/28/13 0505  WBC 12.6* 14.8* 13.5* 11.5*  NEUTROABS  --   --  11.0*  --   HGB 15.0 14.1 14.9 13.5  HCT 47.0 45.2 43.5 39.2  MCV 95.4 96.0 89.9 88.3  PLT  --   --  193 203   Recent Results (from the past 240 hour(s))  WOUND CULTURE     Status:  None   Collection Time    10/26/13  2:34 PM      Result Value Range Status   Gram Stain No WBC Seen   Preliminary   Gram Stain No Squamous Epithelial Cells Seen   Preliminary   Gram Stain Few GRAM POSITIVE COCCI IN PAIRS   Preliminary   Preliminary Report Abundant STAPHYLOCOCCUS AUREUS   Preliminary   Comment: Rifampin and Gentamicin should not be used as     single drugs for treatment of Staph infections.  WOUND CULTURE     Status: None   Collection Time    10/27/13  2:47 PM      Result Value Range Status   Specimen Description WOUND LEFT GOIN   Final   Special Requests NONE   Final   Gram Stain     Final   Value: ABUNDANT WBC PRESENT,BOTH PMN AND MONONUCLEAR     NO SQUAMOUS  EPITHELIAL CELLS SEEN     MODERATE GRAM POSITIVE COCCI IN CLUSTERS     Performed at [facility]   Culture     Final   Value: Culture reincubated for better growth     Performed at [facility]   Report Status PENDING   Incomplete  CULTURE, BLOOD (ROUTINE X 2)     Status: None   Collection Time    10/27/13  3:00 PM      Result Value Range Status   Specimen Description BLOOD LEFT ANTECUBITAL   Final   Special Requests BOTTLES DRAWN AEROBIC AND ANAEROBIC 5ML   Final   Culture  Setup Time     Final   Value: 10/27/2013 20:02     Performed at [facility]   Culture     Final   Value:        BLOOD CULTURE RECEIVED NO GROWTH TO DATE CULTURE WILL BE HELD FOR 5 DAYS BEFORE ISSUING A FINAL NEGATIVE REPORT     Performed at [facility]   Report Status PENDING   Incomplete  CULTURE, BLOOD (ROUTINE X 2)     Status: None   Collection Time    10/27/13  3:15 PM      Result Value Range Status   Specimen Description BLOOD RIGHT HAND   Final   Special Requests BOTTLES DRAWN AEROBIC AND ANAEROBIC 5ML   Final   Culture  Setup Time     Final   Value: 10/27/2013 20:02     Performed at [facility]   Culture     Final   Value:        BLOOD CULTURE RECEIVED NO GROWTH TO DATE CULTURE WILL BE HELD FOR 5 DAYS BEFORE ISSUING A FINAL NEGATIVE REPORT     Performed at [facility]   Report Status PENDING   Incomplete     Scheduled Meds: . heparin  5,000 Units Subcutaneous Q8H  . vancomycin  1,000 mg Intravenous Q8H   Continuous Infusions:   Faye Ramsay, MD  [facility] Pager [phone]  If 7PM-7AM, please contact night-coverage [email or web address] Password [email or web address] 10/28/2013, 4:28 PM   LOS: 1 day

## 2013-10-29 DIAGNOSIS — L02219 Cutaneous abscess of trunk, unspecified: Secondary | ICD-10-CM

## 2013-10-29 DIAGNOSIS — L02419 Cutaneous abscess of limb, unspecified: Secondary | ICD-10-CM

## 2013-10-29 DIAGNOSIS — L03319 Cellulitis of trunk, unspecified: Secondary | ICD-10-CM

## 2013-10-29 DIAGNOSIS — L03119 Cellulitis of unspecified part of limb: Secondary | ICD-10-CM

## 2013-10-29 LAB — WOUND CULTURE
Gram Stain: NONE SEEN
Gram Stain: NONE SEEN

## 2013-10-29 LAB — BASIC METABOLIC PANEL
BUN: 12 mg/dL (ref 6–23)
CHLORIDE: 103 meq/L (ref 96–112)
CO2: 24 mEq/L (ref 19–32)
CREATININE: 0.81 mg/dL (ref 0.50–1.35)
Calcium: 8.2 mg/dL — ABNORMAL LOW (ref 8.4–10.5)
GFR calc Af Amer: >90 mL/min (ref >90)
GFR calc non Af Amer: >90 mL/min (ref >90)
Glucose, Bld: 117 mg/dL — ABNORMAL HIGH (ref 70–99)
Potassium: 4.2 mEq/L (ref 3.7–5.3)
Sodium: 138 mEq/L (ref 137–147)

## 2013-10-29 LAB — CBC
HEMATOCRIT: 40.3 % (ref 39.0–52.0)
Hemoglobin: 13.8 g/dL (ref 13.0–17.0)
MCH: 30.4 pg (ref 26.0–34.0)
MCHC: 34.2 g/dL (ref 30.0–36.0)
MCV: 88.8 fL (ref 78.0–100.0)
Platelets: 213 10*3/uL (ref 150–400)
RBC: 4.54 MIL/uL (ref 4.22–5.81)
RDW: 12.6 % (ref 11.5–15.5)
WBC: 9.2 10*3/uL (ref 4.0–10.5)

## 2013-10-29 NOTE — Consult Note (Signed)
Reason for Consult:  Left groin abscess Referring Physician: Dr. Bonnielee Haff, MD   Guy Walker is an 53 y.o. male.  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  It started as a pimple.  He was treated with Rocephin, doxycycline and I&D of the left groin at that time.  He returned on 2/9 for recheck and had worsening erythema of the groin.  He was admitted to Medicine and place on Vancomycin.  He has had some improvement but still has pain and erythema Left groin and erythema that he says is improving.  Dr. [place] Pink ask [place] to see him.  He still has a good deal of induration Left groin, it is still very painful and I think he may benefit from it being opened more.  He is growing staph aureus so Vancomycin should work well.   History reviewed. No pertinent past medical history.  Past Surgical History  Procedure Laterality Date  . Nodules removed from throat  1970's    History reviewed. No pertinent family history.  Social History:  reports that he quit smoking about 7 months ago. His smoking use included Cigarettes. He smoked 0.00 packs per day. He has never used smokeless tobacco. He reports that he does not drink alcohol or use illicit drugs. Tobacco, less than 1PPD for 20 plus years ETOH:  None Drugs:  None   Allergies: No Known Allergies  Prior to Admission medications   Medication Sig Start Date End Date Taking? Authorizing Provider  doxycycline (VIBRA-TABS) 100 MG tablet Take 1 tablet (100 mg total) by mouth 2 (two) times daily. 10/26/13  Yes Thao P Le, DO  oxyCODONE-acetaminophen (ROXICET) 5-325 MG per tablet Take 1 tablet by mouth every 8 (eight) hours as needed for severe pain. 10/26/13  Yes Thao P Le, DO     Results for orders placed during the hospital encounter of 10/27/13 (from the past 48 hour(s))  CBC WITH DIFFERENTIAL     Status: Abnormal   Collection Time    10/27/13 12:35 PM   Result Value Ref Range   WBC 13.5 (*) 4.0 - 10.5 K/uL   RBC 4.84  4.22 - 5.81 MIL/uL   Hemoglobin 14.9  13.0 - 17.0 g/dL   HCT 43.5  39.0 - 52.0 %   MCV 89.9  78.0 - 100.0 fL   MCH 30.8  26.0 - 34.0 pg   MCHC 34.3  30.0 - 36.0 g/dL   RDW 12.7  11.5 - 15.5 %   Platelets 193  150 - 400 K/uL   Neutrophils Relative % 81 (*) 43 - 77 %   Neutro Abs 11.0 (*) 1.7 - 7.7 K/uL   Lymphocytes Relative 10 (*) 12 - 46 %   Lymphs Abs 1.4  0.7 - 4.0 K/uL   Monocytes Relative 8  3 - 12 %   Monocytes Absolute 1.0  0.1 - 1.0 K/uL   Eosinophils Relative 1  0 - 5 %   Eosinophils Absolute 0.1  0.0 - 0.7 K/uL   Basophils Relative 0  0 - 1 %   Basophils Absolute 0.0  0.0 - 0.1 K/uL  COMPREHENSIVE METABOLIC PANEL     Status: Abnormal   Collection Time    10/27/13 12:35 PM      Result Value Ref Range   Sodium 138  137 - 147 mEq/L   Potassium 4.0  3.7 - 5.3 mEq/L   Chloride 103  96 -  112 mEq/L   CO2 23  19 - 32 mEq/L   Glucose, Bld 127 (*) 70 - 99 mg/dL   BUN 13  6 - 23 mg/dL   Creatinine, Ser 0.81  0.50 - 1.35 mg/dL   Calcium 9.0  8.4 - 10.5 mg/dL   Total Protein 7.0  6.0 - 8.3 g/dL   Albumin 3.2 (*) 3.5 - 5.2 g/dL   AST 22  0 - 37 U/L   ALT 32  0 - 53 U/L   Alkaline Phosphatase 111  39 - 117 U/L   Total Bilirubin 0.5  0.3 - 1.2 mg/dL   GFR calc non Af Amer >90  >90 mL/min   GFR calc Af Amer >90  >90 mL/min   Comment: (NOTE)     The eGFR has been calculated using the CKD EPI equation.     This calculation has not been validated in all clinical situations.     eGFR's persistently <90 mL/min signify possible Chronic Kidney     Disease.  WOUND CULTURE     Status: None   Collection Time    10/27/13  2:47 PM      Result Value Ref Range   Specimen Description WOUND LEFT GOIN     Special Requests NONE     Gram Stain       Value: ABUNDANT WBC PRESENT,BOTH PMN AND MONONUCLEAR     NO SQUAMOUS EPITHELIAL CELLS SEEN     MODERATE GRAM POSITIVE COCCI IN CLUSTERS     Performed at [facility]    Culture       Value: MODERATE STAPHYLOCOCCUS AUREUS     Note: RIFAMPIN AND GENTAMICIN SHOULD NOT BE USED AS SINGLE DRUGS FOR TREATMENT OF STAPH INFECTIONS.     Performed at [facility]   Report Status PENDING    CULTURE, BLOOD (ROUTINE X 2)     Status: None   Collection Time    10/27/13  3:00 PM      Result Value Ref Range   Specimen Description BLOOD LEFT ANTECUBITAL     Special Requests BOTTLES DRAWN AEROBIC AND ANAEROBIC 5ML     Culture  Setup Time       Value: 10/27/2013 20:02     Performed at [facility]   Culture       Value:        BLOOD CULTURE RECEIVED NO GROWTH TO DATE CULTURE WILL BE HELD FOR 5 DAYS BEFORE ISSUING A FINAL NEGATIVE REPORT     Performed at [facility]   Report Status PENDING    CULTURE, BLOOD (ROUTINE X 2)     Status: None   Collection Time    10/27/13  3:15 PM      Result Value Ref Range   Specimen Description BLOOD RIGHT HAND     Special Requests BOTTLES DRAWN AEROBIC AND ANAEROBIC 5ML     Culture  Setup Time       Value: 10/27/2013 20:02     Performed at [facility]   Culture       Value:        BLOOD CULTURE RECEIVED NO GROWTH TO DATE CULTURE WILL BE HELD FOR 5 DAYS BEFORE ISSUING A FINAL NEGATIVE REPORT     Performed at [facility]   Report Status PENDING    COMPREHENSIVE METABOLIC PANEL     Status: Abnormal   Collection Time    10/28/13  5:05 AM  Result Value Ref Range   Sodium 135 (*) 137 - 147 mEq/L   Potassium 3.8  3.7 - 5.3 mEq/L   Chloride 101  96 - 112 mEq/L   CO2 22  19 - 32 mEq/L   Glucose, Bld 116 (*) 70 - 99 mg/dL   BUN 11  6 - 23 mg/dL   Creatinine, Ser 0.78  0.50 - 1.35 mg/dL   Calcium 8.1 (*) 8.4 - 10.5 mg/dL   Total Protein 6.1  6.0 - 8.3 g/dL   Albumin 2.7 (*) 3.5 - 5.2 g/dL   AST 16  0 - 37 U/L   ALT 25  0 - 53 U/L   Alkaline Phosphatase 101  39 - 117 U/L   Total Bilirubin 0.4  0.3 - 1.2 mg/dL   GFR calc non Af Amer >90  >90 mL/min   GFR calc Af Amer >90  >90 mL/min    Comment: (NOTE)     The eGFR has been calculated using the CKD EPI equation.     This calculation has not been validated in all clinical situations.     eGFR's persistently <90 mL/min signify possible Chronic Kidney     Disease.  CBC     Status: Abnormal   Collection Time    10/28/13  5:05 AM      Result Value Ref Range   WBC 11.5 (*) 4.0 - 10.5 K/uL   RBC 4.44  4.22 - 5.81 MIL/uL   Hemoglobin 13.5  13.0 - 17.0 g/dL   HCT 39.2  39.0 - 52.0 %   MCV 88.3  78.0 - 100.0 fL   MCH 30.4  26.0 - 34.0 pg   MCHC 34.4  30.0 - 36.0 g/dL   RDW 12.6  11.5 - 15.5 %   Platelets 203  150 - 400 K/uL  VANCOMYCIN, TROUGH     Status: Abnormal   Collection Time    10/28/13  8:30 PM      Result Value Ref Range   Vancomycin Tr 7.1 (*) 10.0 - 20.0 ug/mL  CBC     Status: None   Collection Time    10/29/13  5:20 AM      Result Value Ref Range   WBC 9.2  4.0 - 10.5 K/uL   RBC 4.54  4.22 - 5.81 MIL/uL   Hemoglobin 13.8  13.0 - 17.0 g/dL   HCT 40.3  39.0 - 52.0 %   MCV 88.8  78.0 - 100.0 fL   MCH 30.4  26.0 - 34.0 pg   MCHC 34.2  30.0 - 36.0 g/dL   RDW 12.6  11.5 - 15.5 %   Platelets 213  150 - 400 K/uL  BASIC METABOLIC PANEL     Status: Abnormal   Collection Time    10/29/13  5:20 AM      Result Value Ref Range   Sodium 138  137 - 147 mEq/L   Potassium 4.2  3.7 - 5.3 mEq/L   Chloride 103  96 - 112 mEq/L   CO2 24  19 - 32 mEq/L   Glucose, Bld 117 (*) 70 - 99 mg/dL   BUN 12  6 - 23 mg/dL   Creatinine, Ser 0.81  0.50 - 1.35 mg/dL   Calcium 8.2 (*) 8.4 - 10.5 mg/dL   GFR calc non Af Amer >90  >90 mL/min   GFR calc Af Amer >90  >90 mL/min   Comment: (NOTE)     The eGFR has been  calculated using the CKD EPI equation.     This calculation has not been validated in all clinical situations.     eGFR's persistently <90 mL/min signify possible Chronic Kidney     Disease.    No results found.  Review of Systems  Constitutional: Positive for fever. Negative for chills, weight loss, malaise/fatigue  and diaphoresis.  HENT: Negative.   Eyes: Negative.   Respiratory: Negative.   Cardiovascular: Negative.   Gastrointestinal: Negative.   Genitourinary: Negative.   Musculoskeletal: Negative.   Skin: Positive for rash (started as a pimple and got worse.).  Neurological: Negative.  Negative for weakness.  Endo/Heme/Allergies: Negative.   Psychiatric/Behavioral: Negative.    Blood pressure 149/82, pulse 71, temperature 98.1 F (36.7 C), temperature source Oral, resp. rate 18, height 5' 8.9" (1.75 m), weight 91.672 kg (202 lb 1.6 oz), SpO2 97.00%. Physical Exam  Constitutional: He is oriented to person, place, and time. He appears well-developed and well-nourished. No distress.  HENT:  Head: Normocephalic and atraumatic.  Nose: Nose normal.  Eyes: Conjunctivae and EOM are normal. Pupils are equal, round, and reactive to light. Right eye exhibits no discharge. Left eye exhibits no discharge.  Neck: Normal range of motion. Neck supple. No JVD present. No tracheal deviation present. No thyromegaly present.  Cardiovascular: Normal rate, regular rhythm, normal heart sounds and intact distal pulses.  Exam reveals no gallop.   No murmur heard. Respiratory: Effort normal and breath sounds normal. No respiratory distress. He has no wheezes. He has no rales. He exhibits no tenderness.  GI: Soft. Bowel sounds are normal. He exhibits no distension and no mass. There is no tenderness. There is no rebound and no guarding.  Musculoskeletal: He exhibits no edema and no tenderness.  Lymphadenopathy:    He has no cervical adenopathy.  Neurological: He is alert and oriented to person, place, and time. No cranial nerve deficit.  Skin: He is not diaphoretic.     He is very tender in the indurated area, the area of erythema is not really tender.  Psychiatric: He has a normal mood and affect. His behavior is normal. Judgment and thought content normal.    Assessment/Plan: 1.  Abscess left groin with  erythema/cellulits   2.  Hx of tobacco/ETOH use, none for 7 months 3.  Body mass index is 29.93 kg/(m^2).   Bauer Ausborn 10/29/2013, 9:09 AM

## 2013-10-29 NOTE — Progress Notes (Signed)
[facility] PROGRESS NOTE  Guy Walker ION:[id number] DOB: 1961/05/06 DOA: 10/27/2013  PCP: Does not have a PCP  Brief HPI: 53 y.o. male with no significant PMH who presented with worsening cellulitis of the pelvic region extending to the left thigh area and also associated with fevers. Sx started about 4 days prior to this admission. Pt was seen in urgent care center and required I&D and discharged on doxycycline. Overnight, cellulitis worsened. In the ED, the pt was noted to have a wbc of 13K and was mildly tachycardic. The patient was started on vanc and hospitalist consulted for admission.  Consultants:  General Surgery  Procedures:  None yet  Antibiotics: IV Vanc 2/9-->  Subjective: Patient feels better wrt pain in the left groin area. Able to move left leg/hip without difficulty.  Objective: Vital Signs  Filed Vitals:   10/28/13 0620 10/28/13 1521 10/28/13 2123 10/29/13 0532  BP: 133/78 133/76 131/74 149/82  Pulse: 79 70 73 71  Temp: 99.6 F (37.6 C) 98.9 F (37.2 C) 99.2 F (37.3 C) 98.1 F (36.7 C)  TempSrc: Oral Oral Oral Oral  Resp: 18 16 18 18   Height:      Weight:    91.672 kg (202 lb 1.6 oz)  SpO2: 94% 98% 96% 97%    Intake/Output Summary (Last 24 hours) at 10/29/13 2440 Last data filed at 10/28/13 1630  Gross per 24 hour  Intake    480 ml  Output   1700 ml  Net  -1220 ml   Filed Weights   10/27/13 1415 10/29/13 0532  Weight: 93.4 kg (205 lb 14.6 oz) 91.672 kg (202 lb 1.6 oz)    General appearance: alert, cooperative, appears stated age and no distress Resp: clear to auscultation bilaterally Cardio: regular rate and rhythm, S1, S2 normal, no murmur, click, rub or gallop GI: soft, non-tender; bowel sounds normal; no masses,  no organomegaly Extremities: Erythema and warmth noted over left groin and upper thigh medially. Tender to palpation. Packing noted. Pulses: 2+ and symmetric Skin: erythema and warmth over left thigh  area. Lymph nodes: Inguinal adenopathy: left Neurologic: No focal deficits.  Lab Results:  Basic Metabolic Panel:  Recent Labs Lab 10/27/13 1235 10/28/13 0505 10/29/13 0520  NA 138 135* 138  K 4.0 3.8 4.2  CL 103 101 103  CO2 23 22 24   GLUCOSE 127* 116* 117*  BUN 13 11 12   CREATININE 0.81 0.78 0.81  CALCIUM 9.0 8.1* 8.2*   Liver Function Tests:  Recent Labs Lab 10/27/13 1235 10/28/13 0505  AST 22 16  ALT 32 25  ALKPHOS 111 101  BILITOT 0.5 0.4  PROT 7.0 6.1  ALBUMIN 3.2* 2.7*   CBC:  Recent Labs Lab 10/26/13 1442 10/27/13 0926 10/27/13 1235 10/28/13 0505 10/29/13 0520  WBC 12.6* 14.8* 13.5* 11.5* 9.2  NEUTROABS  --   --  11.0*  --   --   HGB 15.0 14.1 14.9 13.5 13.8  HCT 47.0 45.2 43.5 39.2 40.3  MCV 95.4 96.0 89.9 88.3 88.8  PLT  --   --  193 203 213    Recent Results (from the past 240 hour(s))  WOUND CULTURE     Status: None   Collection Time    10/26/13  2:34 PM      Result Value Ref Range Status   Gram Stain No WBC Seen   Preliminary   Gram Stain No Squamous Epithelial Cells Seen   Preliminary   Gram Stain Few GRAM POSITIVE  COCCI IN PAIRS   Preliminary   Preliminary Report Abundant STAPHYLOCOCCUS AUREUS   Preliminary   Comment: Rifampin and Gentamicin should not be used as     single drugs for treatment of Staph infections.  WOUND CULTURE     Status: None   Collection Time    10/27/13  2:47 PM      Result Value Ref Range Status   Specimen Description WOUND LEFT GOIN   Final   Special Requests NONE   Final   Gram Stain     Final   Value: ABUNDANT WBC PRESENT,BOTH PMN AND MONONUCLEAR     NO SQUAMOUS EPITHELIAL CELLS SEEN     MODERATE GRAM POSITIVE COCCI IN CLUSTERS     Performed at [facility]   Culture     Final   Value: Culture reincubated for better growth     Performed at [facility]   Report Status PENDING   Incomplete  CULTURE, BLOOD (ROUTINE X 2)     Status: None   Collection Time    10/27/13  3:00 PM       Result Value Ref Range Status   Specimen Description BLOOD LEFT ANTECUBITAL   Final   Special Requests BOTTLES DRAWN AEROBIC AND ANAEROBIC 5ML   Final   Culture  Setup Time     Final   Value: 10/27/2013 20:02     Performed at [facility]   Culture     Final   Value:        BLOOD CULTURE RECEIVED NO GROWTH TO DATE CULTURE WILL BE HELD FOR 5 DAYS BEFORE ISSUING A FINAL NEGATIVE REPORT     Performed at [facility]   Report Status PENDING   Incomplete  CULTURE, BLOOD (ROUTINE X 2)     Status: None   Collection Time    10/27/13  3:15 PM      Result Value Ref Range Status   Specimen Description BLOOD RIGHT HAND   Final   Special Requests BOTTLES DRAWN AEROBIC AND ANAEROBIC 5ML   Final   Culture  Setup Time     Final   Value: 10/27/2013 20:02     Performed at [facility]   Culture     Final   Value:        BLOOD CULTURE RECEIVED NO GROWTH TO DATE CULTURE WILL BE HELD FOR 5 DAYS BEFORE ISSUING A FINAL NEGATIVE REPORT     Performed at [facility]   Report Status PENDING   Incomplete      Studies/Results: No results found.  Medications:  Scheduled: . heparin  5,000 Units Subcutaneous 3 times per day  . vancomycin  1,250 mg Intravenous Q8H   Continuous:  IOE:[id number], acetaminophen, morphine injection, oxyCODONE-acetaminophen  Assessment/Plan:  Principal Problem:   Sepsis Active Problems:   Cellulitis and abscess    Sepsis  This was mild and secondary to left medial thigh abscess and subsequent cellulitis. Improved.  Cellulitis and abscess Left Thigh and Groin Staph noted on cultures from few days ago. Continue Vancomycin. General surgery consult. May need more extensive I&D.   Code Status: Full Code  DVT Prophylaxis: Heparin    Family Communication: Discussed with patient  Disposition Plan: Not ready for discharge. Will likely return home when ready.    LOS: 2 days   [facility] [facility] Pager  [phone] 10/29/2013, 8:28 AM  If 8PM-8AM, please contact night-coverage at [email or web address], password [facility]

## 2013-10-30 ENCOUNTER — Inpatient Hospital Stay (HOSPITAL_COMMUNITY): Payer: Self-pay | Admitting: Certified Registered Nurse Anesthetist

## 2013-10-30 ENCOUNTER — Encounter (HOSPITAL_COMMUNITY): Payer: MEDICAID | Admitting: Certified Registered Nurse Anesthetist

## 2013-10-30 ENCOUNTER — Encounter (HOSPITAL_COMMUNITY): Payer: Self-pay | Admitting: Certified Registered"

## 2013-10-30 ENCOUNTER — Encounter (HOSPITAL_COMMUNITY)
Admission: EM | Disposition: A | Discharge status: Home / Self Care | Payer: Self-pay | Source: Home / Self Care | Attending: Internal Medicine

## 2013-10-30 DIAGNOSIS — B9562 Methicillin resistant Staphylococcus aureus infection as the cause of diseases classified elsewhere: Secondary | ICD-10-CM | POA: Diagnosis present

## 2013-10-30 DIAGNOSIS — L0390 Cellulitis, unspecified: Secondary | ICD-10-CM

## 2013-10-30 HISTORY — PX: INCISION AND DRAINAGE ABSCESS: SHX5864

## 2013-10-30 LAB — CBC
HCT: 40.7 % (ref 39.0–52.0)
HCT: 40 % (ref 39.0–52.0)
HEMOGLOBIN: 14.1 g/dL (ref 13.0–17.0)
Hemoglobin: 14.5 g/dL (ref 13.0–17.0)
MCH: 30.3 pg (ref 26.0–34.0)
MCH: 31.6 pg (ref 26.0–34.0)
MCHC: 34.6 g/dL (ref 30.0–36.0)
MCHC: 36.3 g/dL — ABNORMAL HIGH (ref 30.0–36.0)
MCV: 87.5 fL (ref 78.0–100.0)
MCV: 87.1 fL (ref 78.0–100.0)
PLATELETS: 232 10*3/uL (ref 150–400)
PLATELETS: 237 10*3/uL (ref 150–400)
RBC: 4.65 MIL/uL (ref 4.22–5.81)
RBC: 4.59 MIL/uL (ref 4.22–5.81)
RDW: 12.4 % (ref 11.5–15.5)
RDW: 12.3 % (ref 11.5–15.5)
WBC: 9.2 10*3/uL (ref 4.0–10.5)
WBC: 7.3 10*3/uL (ref 4.0–10.5)

## 2013-10-30 LAB — BASIC METABOLIC PANEL
BUN: 11 mg/dL (ref 6–23)
CO2: 22 meq/L (ref 19–32)
Calcium: 8.3 mg/dL — ABNORMAL LOW (ref 8.4–10.5)
Chloride: 106 mEq/L (ref 96–112)
Creatinine, Ser: 0.74 mg/dL (ref 0.50–1.35)
GFR calc Af Amer: >90 mL/min (ref >90)
GFR calc non Af Amer: >90 mL/min (ref >90)
GLUCOSE: 108 mg/dL — AB (ref 70–99)
POTASSIUM: 4.5 meq/L (ref 3.7–5.3)
Sodium: 140 mEq/L (ref 137–147)

## 2013-10-30 LAB — CREATININE, SERUM
Creatinine, Ser: 0.73 mg/dL (ref 0.50–1.35)
GFR calc Af Amer: >90 mL/min (ref >90)
GFR calc non Af Amer: >90 mL/min (ref >90)

## 2013-10-30 LAB — WOUND CULTURE

## 2013-10-30 LAB — VANCOMYCIN, TROUGH: VANCOMYCIN TR: 12.5 ug/mL (ref 10.0–20.0)

## 2013-10-30 SURGERY — INCISION AND DRAINAGE, ABSCESS
Anesthesia: General | Site: Groin

## 2013-10-30 MED ORDER — LACTATED RINGERS IV SOLN: INTRAVENOUS | Status: DC

## 2013-10-30 MED ORDER — ONDANSETRON HCL 4 MG/2ML IJ SOLN
INTRAMUSCULAR | Status: AC
  Filled 2013-10-30: qty 2

## 2013-10-30 MED ORDER — PROPOFOL 10 MG/ML IV BOLUS
INTRAVENOUS | Status: AC
  Filled 2013-10-30: qty 20

## 2013-10-30 MED ORDER — HEPARIN SODIUM (PORCINE) 5000 UNIT/ML IJ SOLN: 5000.0000 [IU] | Freq: Three times a day (TID) | INTRAMUSCULAR | Status: DC

## 2013-10-30 MED ORDER — LIDOCAINE HCL (CARDIAC) 20 MG/ML IV SOLN
INTRAVENOUS | Status: AC
  Filled 2013-10-30: qty 5

## 2013-10-30 MED ORDER — FENTANYL CITRATE 0.05 MG/ML IJ SOLN
INTRAMUSCULAR | Status: AC
  Filled 2013-10-30: qty 2

## 2013-10-30 MED ORDER — BUPIVACAINE-EPINEPHRINE PF 0.25-1:200000 % IJ SOLN
INTRAMUSCULAR | Status: AC
  Filled 2013-10-30: qty 30

## 2013-10-30 MED ORDER — PROPOFOL 10 MG/ML IV BOLUS
INTRAVENOUS | Status: DC | PRN
  Administered 2013-10-30: 200 mg via INTRAVENOUS

## 2013-10-30 MED ORDER — DEXAMETHASONE SODIUM PHOSPHATE 10 MG/ML IJ SOLN
INTRAMUSCULAR | Status: AC
  Filled 2013-10-30: qty 1

## 2013-10-30 MED ORDER — FENTANYL CITRATE 0.05 MG/ML IJ SOLN: 25.0000 ug | INTRAMUSCULAR | Status: DC | PRN

## 2013-10-30 MED ORDER — FENTANYL CITRATE 0.05 MG/ML IJ SOLN
INTRAMUSCULAR | Status: AC
  Filled 2013-10-30: qty 5

## 2013-10-30 MED ORDER — BUPIVACAINE HCL (PF) 0.5 % IJ SOLN
INTRAMUSCULAR | Status: AC
  Filled 2013-10-30: qty 30

## 2013-10-30 MED ORDER — FENTANYL CITRATE 0.05 MG/ML IJ SOLN
INTRAMUSCULAR | Status: DC | PRN
  Administered 2013-10-30 (×4): 50 ug via INTRAVENOUS

## 2013-10-30 MED ORDER — SUCCINYLCHOLINE CHLORIDE 20 MG/ML IJ SOLN
INTRAMUSCULAR | Status: AC
  Filled 2013-10-30: qty 1

## 2013-10-30 MED ORDER — MIDAZOLAM HCL 5 MG/5ML IJ SOLN
INTRAMUSCULAR | Status: DC | PRN
  Administered 2013-10-30: 2 mg via INTRAVENOUS

## 2013-10-30 MED ORDER — HEPARIN SODIUM (PORCINE) 5000 UNIT/ML IJ SOLN
5000.0000 [IU] | Freq: Three times a day (TID) | INTRAMUSCULAR | Status: DC
  Administered 2013-10-30 – 2013-11-02 (×8): 5000 [IU] via SUBCUTANEOUS
  Filled 2013-10-30 (×11): qty 1

## 2013-10-30 MED ORDER — ONDANSETRON HCL 4 MG/2ML IJ SOLN
INTRAMUSCULAR | Status: DC | PRN
Start: 2013-10-30 — End: 2013-10-30
  Administered 2013-10-30: 4 mg via INTRAVENOUS

## 2013-10-30 MED ORDER — HYDROGEN PEROXIDE 3 % EX SOLN
CUTANEOUS | Status: DC | PRN
  Administered 2013-10-30: 1 via TOPICAL

## 2013-10-30 MED ORDER — LACTATED RINGERS IV SOLN
INTRAVENOUS | Status: DC | PRN
  Administered 2013-10-30: 16:00:00 via INTRAVENOUS

## 2013-10-30 MED ORDER — MIDAZOLAM HCL 2 MG/2ML IJ SOLN
INTRAMUSCULAR | Status: AC
  Filled 2013-10-30: qty 2

## 2013-10-30 MED ORDER — HYDROGEN PEROXIDE 3 % EX SOLN
CUTANEOUS | Status: AC
  Filled 2013-10-30: qty 473

## 2013-10-30 MED ORDER — 0.9 % SODIUM CHLORIDE (POUR BTL) OPTIME
TOPICAL | Status: DC | PRN
  Administered 2013-10-30: 1000 mL

## 2013-10-30 SURGICAL SUPPLY — 33 items
BLADE SURG 15 STRL LF DISP TIS (BLADE) ×1 IMPLANT
BLADE SURG 15 STRL SS (BLADE) ×2
BNDG GAUZE ELAST 4 BULKY (GAUZE/BANDAGES/DRESSINGS) IMPLANT
BRIEF STRETCH FOR OB PAD LRG (UNDERPADS AND DIAPERS) ×3 IMPLANT
CANISTER SUCTION 2500CC (MISCELLANEOUS) ×3 IMPLANT
COVER SURGICAL LIGHT HANDLE (MISCELLANEOUS) ×3 IMPLANT
DECANTER SPIKE VIAL GLASS SM (MISCELLANEOUS) IMPLANT
DRAPE LAPAROSCOPIC ABDOMINAL (DRAPES) IMPLANT
ELECT REM PT RETURN 9FT ADLT (ELECTROSURGICAL) ×3
ELECTRODE REM PT RTRN 9FT ADLT (ELECTROSURGICAL) ×1 IMPLANT
GAUZE PACKING IODOFORM 1/2 (PACKING) ×3 IMPLANT
GLOVE BIO SURGEON STRL SZ7.5 (GLOVE) ×3 IMPLANT
GLOVE BIOGEL PI IND STRL 7.5 (GLOVE) ×2 IMPLANT
GLOVE BIOGEL PI INDICATOR 7.5 (GLOVE) ×4
GOWN STRL REUS W/TWL LRG LVL3 (GOWN DISPOSABLE) ×6 IMPLANT
KIT BASIN OR (CUSTOM PROCEDURE TRAY) ×3 IMPLANT
NEEDLE HYPO 25X1 1.5 SAFETY (NEEDLE) IMPLANT
NS IRRIG 1000ML POUR BTL (IV SOLUTION) ×3 IMPLANT
PAD ABD 8X10 STRL (GAUZE/BANDAGES/DRESSINGS) ×3 IMPLANT
PENCIL BUTTON HOLSTER BLD 10FT (ELECTRODE) IMPLANT
SPONGE GAUZE 4X4 12PLY (GAUZE/BANDAGES/DRESSINGS) ×3 IMPLANT
SPONGE LAP 18X18 X RAY DECT (DISPOSABLE) IMPLANT
SUT MNCRL AB 4-0 PS2 18 (SUTURE) IMPLANT
SUT VIC AB 3-0 SH 27 (SUTURE)
SUT VIC AB 3-0 SH 27XBRD (SUTURE) IMPLANT
SWAB COLLECTION DEVICE MRSA (MISCELLANEOUS) ×3 IMPLANT
SYR BULB 3OZ (MISCELLANEOUS) IMPLANT
SYR CONTROL 10ML LL (SYRINGE) IMPLANT
TAPE CLOTH SURG 4X10 WHT LF (GAUZE/BANDAGES/DRESSINGS) ×3 IMPLANT
TOWEL OR 17X26 10 PK STRL BLUE (TOWEL DISPOSABLE) ×3 IMPLANT
TUBE ANAEROBIC SPECIMEN COL (MISCELLANEOUS) ×3 IMPLANT
WATER STERILE IRR 1000ML POUR (IV SOLUTION) IMPLANT
YANKAUER SUCT BULB TIP NO VENT (SUCTIONS) IMPLANT

## 2013-10-30 NOTE — Progress Notes (Signed)
Day of Surgery  Subjective: Still having pain and ongoing purulent drainage.  Objective: Vital signs in last 24 hours: Temp:  [97.5 F (36.4 C)-98.9 F (37.2 C)] 98.5 F (36.9 C) (02/12 0815) Pulse Rate:  [64-74] 70 (02/12 0815) Resp:  [16-20] 20 (02/12 0544) BP: (132-143)/(77-82) 132/81 mmHg (02/12 0815) SpO2:  [95 %-100 %] 100 % (02/12 0815) Last BM Date: 10/29/13 Afebrile, VSS, Labs OK Intake/Output from previous day: 02/11 0701 - 02/12 0700 In: 860 [P.O.:360; IV Piggyback:500] Out: 1150 [Urine:1150] Intake/Output this shift: Total I/O In: -  Out: 300 [Urine:300]  General appearance: alert, cooperative and no distress Skin: Cellulitis is better, still indurated with ongoing purulent drainage.  Lab Results:   Recent Labs  10/29/13 0520 10/30/13 0550  WBC 9.2 9.2  HGB 13.8 14.1  HCT 40.3 40.7  PLT 213 232    BMET  Recent Labs  10/29/13 0520 10/30/13 0550  NA 138 140  K 4.2 4.5  CL 103 106  CO2 24 22  GLUCOSE 117* 108*  BUN 12 11  CREATININE 0.81 0.74  CALCIUM 8.2* 8.3*   PT/INR No results found for this basename: LABPROT, INR,  in the last 72 hours   Recent Labs Lab 10/27/13 1235 10/28/13 0505  AST 22 16  ALT 32 25  ALKPHOS 111 101  BILITOT 0.5 0.4  PROT 7.0 6.1  ALBUMIN 3.2* 2.7*     Lipase  No results found for this basename: lipase     Studies/Results: No results found.  Medications: . heparin  5,000 Units Subcutaneous 3 times per day  . vancomycin  1,250 mg Intravenous Q8H    Assessment/Plan 1. Abscess left groin with erythema/cellulits  2. Hx of tobacco/ETOH use, none for 7 months  3. Body mass index is 29.93 kg/(m^2).   Plan:  OR later today. Wound Culture:   METHICILLIN RESISTANT STAPHYLOCOCCUS AUREUS       LOS: 3 days    Roslynn Holte 10/30/2013

## 2013-10-30 NOTE — Anesthesia Preprocedure Evaluation (Addendum)
Anesthesia Evaluation  Patient identified by MRN, date of birth, ID band Patient awake    Reviewed: Allergy & Precautions, H&P , NPO status , Patient's Chart, lab work & pertinent test results  Airway Mallampati: II TM Distance: >3 FB Neck ROM: full    Dental no notable dental hx. (+) Teeth Intact, Dental Advisory Given   Pulmonary neg pulmonary ROS, former smoker,  breath sounds clear to auscultation  Pulmonary exam normal       Cardiovascular Exercise Tolerance: Good negative cardio ROS  Rhythm:regular Rate:Normal     Neuro/Psych negative neurological ROS  negative psych ROS   GI/Hepatic negative GI ROS, Neg liver ROS,   Endo/Other  negative endocrine ROS  Renal/GU negative Renal ROS  negative genitourinary   Musculoskeletal   Abdominal   Peds  Hematology negative hematology ROS (+)   Anesthesia Other Findings Sepsis due to abscess  Reproductive/Obstetrics negative OB ROS                          Anesthesia Physical Anesthesia Plan  ASA: III  Anesthesia Plan: General   Post-op Pain Management:    Induction: Intravenous  Airway Management Planned: LMA  Additional Equipment:   Intra-op Plan:   Post-operative Plan:   Informed Consent: I have reviewed the patients History and Physical, chart, labs and discussed the procedure including the risks, benefits and alternatives for the proposed anesthesia with the patient or authorized representative who has indicated his/her understanding and acceptance.   Dental Advisory Given  Plan Discussed with: CRNA and Surgeon  Anesthesia Plan Comments:         Anesthesia Quick Evaluation

## 2013-10-30 NOTE — Progress Notes (Signed)
[facility] PROGRESS NOTE  Guy Walker [id number] DOB: 14-May-1961 DOA: 10/27/2013  PCP: Does not have a PCP  Brief HPI: 53 y.o. male with no significant PMH who presented with worsening cellulitis of the pelvic region extending to the left thigh area and also associated with fevers. Sx started about 4 days prior to this admission. Pt was seen in urgent care center and required I&D and discharged on doxycycline. Overnight, cellulitis worsened. In the ED, the pt was noted to have a wbc of 13K and was mildly tachycardic. The patient was started on vanc and hospitalist consulted for admission.  Consultants:  General Surgery  Procedures:  I&D planned for 2/12  Antibiotics: IV Vanc 2/9-->  Subjective: Patient feels about same. Not much better. Pain is still 7/10.   Objective: Vital Signs  Filed Vitals:   10/29/13 1400 10/29/13 2058 10/30/13 0544 10/30/13 0815  BP: 133/78 143/77 135/82 132/81  Pulse: 64 74 74 70  Temp: 97.5 F (36.4 C) 98.9 F (37.2 C) 98.7 F (37.1 C) 98.5 F (36.9 C)  TempSrc: Oral Oral Oral Oral  Resp: 16 18 20    Height:      Weight:      SpO2: 98% 96% 95% 100%    Intake/Output Summary (Last 24 hours) at 10/30/13 [id number] Last data filed at 10/30/13 0423  Gross per 24 hour  Intake    620 ml  Output    925 ml  Net   -305 ml   Filed Weights   10/27/13 1415 10/29/13 0532  Weight: 93.4 kg (205 lb 14.6 oz) 91.672 kg (202 lb 1.6 oz)    General appearance: alert, cooperative, appears stated age and no distress Resp: clear to auscultation bilaterally Cardio: regular rate and rhythm, S1, S2 normal, no murmur, click, rub or gallop GI: soft, non-tender; bowel sounds normal; no masses,  no organomegaly Extremities: Erythema and warmth noted over left groin and upper thigh medially. Tender to palpation. Packing noted. Not much different from 2/11 Pulses: 2+ and symmetric Skin: erythema and warmth over left thigh area. Lymph nodes:  Inguinal adenopathy: left Neurologic: No focal deficits.  Lab Results:  Basic Metabolic Panel:  Recent Labs Lab 10/27/13 1235 10/28/13 0505 10/29/13 0520 10/30/13 0550  NA 138 135* 138 140  K 4.0 3.8 4.2 4.5  CL 103 101 103 106  CO2 23 22 24 22   GLUCOSE 127* 116* 117* 108*  BUN 13 11 12 11   CREATININE 0.81 0.78 0.81 0.74  CALCIUM 9.0 8.1* 8.2* 8.3*   Liver Function Tests:  Recent Labs Lab 10/27/13 1235 10/28/13 0505  AST 22 16  ALT 32 25  ALKPHOS 111 101  BILITOT 0.5 0.4  PROT 7.0 6.1  ALBUMIN 3.2* 2.7*   CBC:  Recent Labs Lab 10/27/13 0926 10/27/13 1235 10/28/13 0505 10/29/13 0520 10/30/13 0550  WBC 14.8* 13.5* 11.5* 9.2 9.2  NEUTROABS  --  11.0*  --   --   --   HGB 14.1 14.9 13.5 13.8 14.1  HCT 45.2 43.5 39.2 40.3 40.7  MCV 96.0 89.9 88.3 88.8 87.5  PLT  --  193 203 213 232    Recent Results (from the past 240 hour(s))  WOUND CULTURE     Status: None   Collection Time    10/26/13  2:34 PM      Result Value Ref Range Status   Culture     Final   Value: Abundant METHICILLIN RESISTANT STAPHYLOCOCCUS AUREUS   Gram Stain No WBC  Seen   Final   Gram Stain No Squamous Epithelial Cells Seen   Final   Gram Stain Few GRAM POSITIVE COCCI IN PAIRS   Final   Organism ID, Bacteria METHICILLIN RESISTANT STAPHYLOCOCCUS AUREUS   Final   Comment: Rifampin and Gentamicin should not be used as     single drugs for treatment of Staph infections.     This organism DOES NOT demonstrate inducible     Clindamycin resistance in vitro.  WOUND CULTURE     Status: None   Collection Time    10/27/13  2:47 PM      Result Value Ref Range Status   Specimen Description WOUND LEFT GOIN   Final   Special Requests NONE   Final   Gram Stain     Final   Value: ABUNDANT WBC PRESENT,BOTH PMN AND MONONUCLEAR     NO SQUAMOUS EPITHELIAL CELLS SEEN     MODERATE GRAM POSITIVE COCCI IN CLUSTERS     Performed at [facility]   Culture     Final   Value: MODERATE STAPHYLOCOCCUS  AUREUS     Note: RIFAMPIN AND GENTAMICIN SHOULD NOT BE USED AS SINGLE DRUGS FOR TREATMENT OF STAPH INFECTIONS.     Performed at [facility]   Report Status PENDING   Incomplete  CULTURE, BLOOD (ROUTINE X 2)     Status: None   Collection Time    10/27/13  3:00 PM      Result Value Ref Range Status   Specimen Description BLOOD LEFT ANTECUBITAL   Final   Special Requests BOTTLES DRAWN AEROBIC AND ANAEROBIC 5ML   Final   Culture  Setup Time     Final   Value: 10/27/2013 20:02     Performed at [facility]   Culture     Final   Value:        BLOOD CULTURE RECEIVED NO GROWTH TO DATE CULTURE WILL BE HELD FOR 5 DAYS BEFORE ISSUING A FINAL NEGATIVE REPORT     Performed at [facility]   Report Status PENDING   Incomplete  CULTURE, BLOOD (ROUTINE X 2)     Status: None   Collection Time    10/27/13  3:15 PM      Result Value Ref Range Status   Specimen Description BLOOD RIGHT HAND   Final   Special Requests BOTTLES DRAWN AEROBIC AND ANAEROBIC 5ML   Final   Culture  Setup Time     Final   Value: 10/27/2013 20:02     Performed at [facility]   Culture     Final   Value:        BLOOD CULTURE RECEIVED NO GROWTH TO DATE CULTURE WILL BE HELD FOR 5 DAYS BEFORE ISSUING A FINAL NEGATIVE REPORT     Performed at [facility]   Report Status PENDING   Incomplete      Studies/Results: No results found.  Medications:  Scheduled: . heparin  5,000 Units Subcutaneous 3 times per day  . vancomycin  1,250 mg Intravenous Q8H   Continuous:  KNL:[id number], acetaminophen, morphine injection, oxyCODONE-acetaminophen  Assessment/Plan:  Principal Problem:   Sepsis Active Problems:   Cellulitis and abscess    Sepsis  This was mild and secondary to left medial thigh abscess and subsequent cellulitis. Improved.  Cellulitis and abscess Left Thigh and Groin with MRSA MRSA noted on wound cultures from few days ago. Continue Vancomycin for now. General  surgery has  seen and they plan to take him to OR for I&D today. WBC has improved.   Code Status: Full Code  DVT Prophylaxis: Heparin    Family Communication: Discussed with patient  Disposition Plan: Not ready for discharge. Will likely return home when ready.    LOS: 3 days   [facility] Pager [phone] 10/30/2013, 9:03 AM  If 8PM-8AM, please contact night-coverage at [email or web address], password [facility]

## 2013-10-30 NOTE — Anesthesia Postprocedure Evaluation (Signed)
  Anesthesia Post-op Note  Patient: Financial planner  Procedure(s) Performed: Procedure(s) (LRB): INCISION AND DRAINAGE ABSCESS LEFT INNER GROIN (N/A)  Patient Location: PACU  Anesthesia Type: General  Level of Consciousness: awake and alert   Airway and Oxygen Therapy: Patient Spontanous Breathing  Post-op Pain: mild  Post-op Assessment: Post-op Vital signs reviewed, Patient's Cardiovascular Status Stable, Respiratory Function Stable, Patent Airway and No signs of Nausea or vomiting  Last Vitals:  Filed Vitals:   10/30/13 1714  BP: 132/78  Pulse: 73  Temp: 37 C  Resp: 21    Post-op Vital Signs: stable   Complications: No apparent anesthesia complications

## 2013-10-30 NOTE — Op Note (Signed)
Surgeon: Kaylyn Lim, MD, FACS  Asst:  none  Anes:  Gen.  Procedure: Incision and drainage of large left medial thigh abscess for culture  Diagnosis: MRSA abscess  Complications: none  EBL:   20 cc  Description of Procedure:  The patient was taken to room 11 in given general anesthesia. He was placed in the dorsal lithotomy position and prepped with Betadine. Timeout was performed. The little tiny opening in his left posterior thigh was opened by me after exploring the area with a Kelly clamp. I opened this up about 1-1/2 inches going anteriorly and then inserted my finger and went up to the indurated area which was: Anteriorly. I allowed my finger to explore all of the indurated areas in the medial thigh. The drainage was cultured. It was certainly compatible with MRSA. It was then packed with a bottle of half-inch iodoform gauze. Patient was taken recovery room in satisfactory condition.  Matt B. Hassell Done, [place], [facility], [place]

## 2013-10-30 NOTE — Interval H&P Note (Signed)
History and Physical Interval Note:  10/30/2013 4:21 PM  Guy Walker  has presented today for surgery, with the diagnosis of left groin abscess  The various methods of treatment have been discussed with the patient and family. After consideration of risks, benefits and other options for treatment, the patient has consented to  Procedure(s): INCISION AND DRAINAGE ABSCESS (N/A) as a surgical intervention .  The patient's history has been reviewed, patient examined, no change in status, stable for surgery.  I have reviewed the patient's chart and labs.  Questions were answered to the patient's satisfaction.     [name]

## 2013-10-30 NOTE — Preoperative (Signed)
Beta Blockers   Reason not to administer Beta Blockers:Not Applicable 

## 2013-10-30 NOTE — Progress Notes (Signed)
pt is going for an I&D this am, and has heparin 5000 units sq  to be given this morning, PA made aware who said to hold this am dose, and this am dose was therefore not given. -------Francoise Chojnowski, rn

## 2013-10-30 NOTE — Progress Notes (Signed)
CRITICAL VALUE ALERT  Critical value received:  MRSA left upper leg  Date of notification:  10-30-13  Time of notification:  0930  Critical value read back: yes  Nurse who received alert:  Henrietta Dine  MD notified (1st page):  Dr. Maryland Pink  Time of first page:  [phone]  MD notified (2nd page):  Time of second page:  Responding MD:  Dr. Maryland Pink  Time MD responded:  [phone]

## 2013-10-30 NOTE — Transfer of Care (Signed)
Immediate Anesthesia Transfer of Care Note  Patient: Guy Walker  Procedure(s) Performed: Procedure(s) (LRB): INCISION AND DRAINAGE ABSCESS LEFT INNER GROIN (N/A)  Patient Location: PACU  Anesthesia Type: General  Level of Consciousness: sedated, patient cooperative and responds to stimulation  Airway & Oxygen Therapy: Patient Spontanous Breathing and Patient connected to face mask oxgen  Post-op Assessment: Report given to PACU RN and Post -op Vital signs reviewed and stable  Post vital signs: Reviewed and stable  Complications: No apparent anesthesia complications

## 2013-10-30 NOTE — Progress Notes (Signed)
ANTIBIOTIC CONSULT NOTE - FOLLOW UP  Pharmacy Consult for Vancomycin Indication: Cellulitis  No Known Allergies  Patient Measurements: Height: 5' 8.9" (175 cm) Weight: 202 lb 1.6 oz (91.672 kg) IBW/kg (Calculated) : 70.47 Adjusted Body Weight:   Vital Signs: Temp: 98.4 F (36.9 C) (02/12 1100) Temp src: Oral (02/12 1100) BP: 138/84 mmHg (02/12 1100) Pulse Rate: 65 (02/12 1100) Intake/Output from previous day: 02/11 0701 - 02/12 0700 In: 860 [P.O.:360; IV Piggyback:500] Out: 1150 [Urine:1150] Intake/Output from this shift: Total I/O In: -  Out: 300 [Urine:300]  Labs:  Recent Labs  10/28/13 0505 10/29/13 0520 10/30/13 0550  WBC 11.5* 9.2 9.2  HGB 13.5 13.8 14.1  PLT 203 213 232  CREATININE 0.78 0.81 0.74   Estimated Creatinine Clearance: 123.4 ml/min (by C-G formula based on Cr of 0.74).  Recent Labs  10/28/13 2030 10/30/13 1038  VANCOTROUGH 7.1* 12.5     Microbiology: Recent Results (from the past 720 hour(s))  WOUND CULTURE     Status: None   Collection Time    10/26/13  2:34 PM      Result Value Ref Range Status   Culture     Final   Value: Abundant METHICILLIN RESISTANT STAPHYLOCOCCUS AUREUS   Gram Stain No WBC Seen   Final   Gram Stain No Squamous Epithelial Cells Seen   Final   Gram Stain Few GRAM POSITIVE COCCI IN PAIRS   Final   Organism ID, Bacteria METHICILLIN RESISTANT STAPHYLOCOCCUS AUREUS   Final   Comment: Rifampin and Gentamicin should not be used as     single drugs for treatment of Staph infections.     This organism DOES NOT demonstrate inducible     Clindamycin resistance in vitro.  WOUND CULTURE     Status: None   Collection Time    10/27/13  2:47 PM      Result Value Ref Range Status   Specimen Description WOUND LEFT GOIN   Final   Special Requests NONE   Final   Gram Stain     Final   Value: ABUNDANT WBC PRESENT,BOTH PMN AND MONONUCLEAR     NO SQUAMOUS EPITHELIAL CELLS SEEN     MODERATE GRAM POSITIVE COCCI IN CLUSTERS      Performed at [facility]   Culture     Final   Value: MODERATE METHICILLIN RESISTANT STAPHYLOCOCCUS AUREUS     Note: RIFAMPIN AND GENTAMICIN SHOULD NOT BE USED AS SINGLE DRUGS FOR TREATMENT OF STAPH INFECTIONS. This organism DOES NOT demonstrate inducible Clindamycin resistance in vitro. CRITICAL RESULT CALLED TO, READ BACK BY AND VERIFIED WITH: KIRSTIN [email or web address]       ON [id number] BY [facility]     Performed at [facility]   Report Status 10/30/2013 FINAL   Final   Organism ID, Bacteria METHICILLIN RESISTANT STAPHYLOCOCCUS AUREUS   Final  CULTURE, BLOOD (ROUTINE X 2)     Status: None   Collection Time    10/27/13  3:00 PM      Result Value Ref Range Status   Specimen Description BLOOD LEFT ANTECUBITAL   Final   Special Requests BOTTLES DRAWN AEROBIC AND ANAEROBIC 5ML   Final   Culture  Setup Time     Final   Value: 10/27/2013 20:02     Performed at [facility]   Culture     Final   Value:        BLOOD CULTURE RECEIVED NO GROWTH TO DATE CULTURE WILL BE HELD  FOR 5 DAYS BEFORE ISSUING A FINAL NEGATIVE REPORT     Performed at [facility]   Report Status PENDING   Incomplete  CULTURE, BLOOD (ROUTINE X 2)     Status: None   Collection Time    10/27/13  3:15 PM      Result Value Ref Range Status   Specimen Description BLOOD RIGHT HAND   Final   Special Requests BOTTLES DRAWN AEROBIC AND ANAEROBIC 5ML   Final   Culture  Setup Time     Final   Value: 10/27/2013 20:02     Performed at [facility]   Culture     Final   Value:        BLOOD CULTURE RECEIVED NO GROWTH TO DATE CULTURE WILL BE HELD FOR 5 DAYS BEFORE ISSUING A FINAL NEGATIVE REPORT     Performed at [facility]   Report Status PENDING   Incomplete    Medical History: History reviewed. No pertinent past medical history.   Assessment: 53 y/o M with cellulitis and abscess of L groin, now on D#4 vancomycin, current dosage 1250mg  IV q8h.   Vancomycin trough 12.5 this AM  (improved)  SCr stable, WNL.  MRSA isolated from groin wound culture  Plans for OR today noted.   Anticipate that after surgical drainage vancomycin trough of 10-15 should be sufficient.  Aiming for higher levels could increase the risk of nephrotoxicity.  Plan: 1. Continue vancomycin 1250 mg IV q8h. 2. Follow serum creatinine, clinical course.  Clayburn Pert, PharmD, BCPS Pager: [phone] 10/30/2013  12:25 PM

## 2013-10-30 NOTE — H&P (View-Only) (Signed)
Reason for Consult:  Left groin abscess Referring Physician: Dr. Bonnielee Haff, MD   Guy Walker is an 53 y.o. male.  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  It started as a pimple.  He was treated with Rocephin, doxycycline and I&D of the left groin at that time.  He returned on 2/9 for recheck and had worsening erythema of the groin.  He was admitted to Medicine and place on Vancomycin.  He has had some improvement but still has pain and erythema Left groin and erythema that he says is improving.  Dr. [place] Pink ask [place] to see him.  He still has a good deal of induration Left groin, it is still very painful and I think he may benefit from it being opened more.  He is growing staph aureus so Vancomycin should work well.   History reviewed. No pertinent past medical history.  Past Surgical History  Procedure Laterality Date  . Nodules removed from throat  1970's    History reviewed. No pertinent family history.  Social History:  reports that he quit smoking about 7 months ago. His smoking use included Cigarettes. He smoked 0.00 packs per day. He has never used smokeless tobacco. He reports that he does not drink alcohol or use illicit drugs. Tobacco, less than 1PPD for 20 plus years ETOH:  None Drugs:  None   Allergies: No Known Allergies  Prior to Admission medications   Medication Sig Start Date End Date Taking? Authorizing Provider  doxycycline (VIBRA-TABS) 100 MG tablet Take 1 tablet (100 mg total) by mouth 2 (two) times daily. 10/26/13  Yes Thao P Le, DO  oxyCODONE-acetaminophen (ROXICET) 5-325 MG per tablet Take 1 tablet by mouth every 8 (eight) hours as needed for severe pain. 10/26/13  Yes Thao P Le, DO     Results for orders placed during the hospital encounter of 10/27/13 (from the past 48 hour(s))  CBC WITH DIFFERENTIAL     Status: Abnormal   Collection Time    10/27/13 12:35 PM   Result Value Ref Range   WBC 13.5 (*) 4.0 - 10.5 K/uL   RBC 4.84  4.22 - 5.81 MIL/uL   Hemoglobin 14.9  13.0 - 17.0 g/dL   HCT 43.5  39.0 - 52.0 %   MCV 89.9  78.0 - 100.0 fL   MCH 30.8  26.0 - 34.0 pg   MCHC 34.3  30.0 - 36.0 g/dL   RDW 12.7  11.5 - 15.5 %   Platelets 193  150 - 400 K/uL   Neutrophils Relative % 81 (*) 43 - 77 %   Neutro Abs 11.0 (*) 1.7 - 7.7 K/uL   Lymphocytes Relative 10 (*) 12 - 46 %   Lymphs Abs 1.4  0.7 - 4.0 K/uL   Monocytes Relative 8  3 - 12 %   Monocytes Absolute 1.0  0.1 - 1.0 K/uL   Eosinophils Relative 1  0 - 5 %   Eosinophils Absolute 0.1  0.0 - 0.7 K/uL   Basophils Relative 0  0 - 1 %   Basophils Absolute 0.0  0.0 - 0.1 K/uL  COMPREHENSIVE METABOLIC PANEL     Status: Abnormal   Collection Time    10/27/13 12:35 PM      Result Value Ref Range   Sodium 138  137 - 147 mEq/L   Potassium 4.0  3.7 - 5.3 mEq/L   Chloride 103  96 -  112 mEq/L   CO2 23  19 - 32 mEq/L   Glucose, Bld 127 (*) 70 - 99 mg/dL   BUN 13  6 - 23 mg/dL   Creatinine, Ser 0.81  0.50 - 1.35 mg/dL   Calcium 9.0  8.4 - 10.5 mg/dL   Total Protein 7.0  6.0 - 8.3 g/dL   Albumin 3.2 (*) 3.5 - 5.2 g/dL   AST 22  0 - 37 U/L   ALT 32  0 - 53 U/L   Alkaline Phosphatase 111  39 - 117 U/L   Total Bilirubin 0.5  0.3 - 1.2 mg/dL   GFR calc non Af Amer >90  >90 mL/min   GFR calc Af Amer >90  >90 mL/min   Comment: (NOTE)     The eGFR has been calculated using the CKD EPI equation.     This calculation has not been validated in all clinical situations.     eGFR's persistently <90 mL/min signify possible Chronic Kidney     Disease.  WOUND CULTURE     Status: None   Collection Time    10/27/13  2:47 PM      Result Value Ref Range   Specimen Description WOUND LEFT GOIN     Special Requests NONE     Gram Stain       Value: ABUNDANT WBC PRESENT,BOTH PMN AND MONONUCLEAR     NO SQUAMOUS EPITHELIAL CELLS SEEN     MODERATE GRAM POSITIVE COCCI IN CLUSTERS     Performed at [facility]    Culture       Value: MODERATE STAPHYLOCOCCUS AUREUS     Note: RIFAMPIN AND GENTAMICIN SHOULD NOT BE USED AS SINGLE DRUGS FOR TREATMENT OF STAPH INFECTIONS.     Performed at [facility]   Report Status PENDING    CULTURE, BLOOD (ROUTINE X 2)     Status: None   Collection Time    10/27/13  3:00 PM      Result Value Ref Range   Specimen Description BLOOD LEFT ANTECUBITAL     Special Requests BOTTLES DRAWN AEROBIC AND ANAEROBIC 5ML     Culture  Setup Time       Value: 10/27/2013 20:02     Performed at [facility]   Culture       Value:        BLOOD CULTURE RECEIVED NO GROWTH TO DATE CULTURE WILL BE HELD FOR 5 DAYS BEFORE ISSUING A FINAL NEGATIVE REPORT     Performed at [facility]   Report Status PENDING    CULTURE, BLOOD (ROUTINE X 2)     Status: None   Collection Time    10/27/13  3:15 PM      Result Value Ref Range   Specimen Description BLOOD RIGHT HAND     Special Requests BOTTLES DRAWN AEROBIC AND ANAEROBIC 5ML     Culture  Setup Time       Value: 10/27/2013 20:02     Performed at [facility]   Culture       Value:        BLOOD CULTURE RECEIVED NO GROWTH TO DATE CULTURE WILL BE HELD FOR 5 DAYS BEFORE ISSUING A FINAL NEGATIVE REPORT     Performed at [facility]   Report Status PENDING    COMPREHENSIVE METABOLIC PANEL     Status: Abnormal   Collection Time    10/28/13  5:05 AM  Result Value Ref Range   Sodium 135 (*) 137 - 147 mEq/L   Potassium 3.8  3.7 - 5.3 mEq/L   Chloride 101  96 - 112 mEq/L   CO2 22  19 - 32 mEq/L   Glucose, Bld 116 (*) 70 - 99 mg/dL   BUN 11  6 - 23 mg/dL   Creatinine, Ser 0.78  0.50 - 1.35 mg/dL   Calcium 8.1 (*) 8.4 - 10.5 mg/dL   Total Protein 6.1  6.0 - 8.3 g/dL   Albumin 2.7 (*) 3.5 - 5.2 g/dL   AST 16  0 - 37 U/L   ALT 25  0 - 53 U/L   Alkaline Phosphatase 101  39 - 117 U/L   Total Bilirubin 0.4  0.3 - 1.2 mg/dL   GFR calc non Af Amer >90  >90 mL/min   GFR calc Af Amer >90  >90 mL/min    Comment: (NOTE)     The eGFR has been calculated using the CKD EPI equation.     This calculation has not been validated in all clinical situations.     eGFR's persistently <90 mL/min signify possible Chronic Kidney     Disease.  CBC     Status: Abnormal   Collection Time    10/28/13  5:05 AM      Result Value Ref Range   WBC 11.5 (*) 4.0 - 10.5 K/uL   RBC 4.44  4.22 - 5.81 MIL/uL   Hemoglobin 13.5  13.0 - 17.0 g/dL   HCT 39.2  39.0 - 52.0 %   MCV 88.3  78.0 - 100.0 fL   MCH 30.4  26.0 - 34.0 pg   MCHC 34.4  30.0 - 36.0 g/dL   RDW 12.6  11.5 - 15.5 %   Platelets 203  150 - 400 K/uL  VANCOMYCIN, TROUGH     Status: Abnormal   Collection Time    10/28/13  8:30 PM      Result Value Ref Range   Vancomycin Tr 7.1 (*) 10.0 - 20.0 ug/mL  CBC     Status: None   Collection Time    10/29/13  5:20 AM      Result Value Ref Range   WBC 9.2  4.0 - 10.5 K/uL   RBC 4.54  4.22 - 5.81 MIL/uL   Hemoglobin 13.8  13.0 - 17.0 g/dL   HCT 40.3  39.0 - 52.0 %   MCV 88.8  78.0 - 100.0 fL   MCH 30.4  26.0 - 34.0 pg   MCHC 34.2  30.0 - 36.0 g/dL   RDW 12.6  11.5 - 15.5 %   Platelets 213  150 - 400 K/uL  BASIC METABOLIC PANEL     Status: Abnormal   Collection Time    10/29/13  5:20 AM      Result Value Ref Range   Sodium 138  137 - 147 mEq/L   Potassium 4.2  3.7 - 5.3 mEq/L   Chloride 103  96 - 112 mEq/L   CO2 24  19 - 32 mEq/L   Glucose, Bld 117 (*) 70 - 99 mg/dL   BUN 12  6 - 23 mg/dL   Creatinine, Ser 0.81  0.50 - 1.35 mg/dL   Calcium 8.2 (*) 8.4 - 10.5 mg/dL   GFR calc non Af Amer >90  >90 mL/min   GFR calc Af Amer >90  >90 mL/min   Comment: (NOTE)     The eGFR has been  calculated using the CKD EPI equation.     This calculation has not been validated in all clinical situations.     eGFR's persistently <90 mL/min signify possible Chronic Kidney     Disease.    No results found.  Review of Systems  Constitutional: Positive for fever. Negative for chills, weight loss, malaise/fatigue  and diaphoresis.  HENT: Negative.   Eyes: Negative.   Respiratory: Negative.   Cardiovascular: Negative.   Gastrointestinal: Negative.   Genitourinary: Negative.   Musculoskeletal: Negative.   Skin: Positive for rash (started as a pimple and got worse.).  Neurological: Negative.  Negative for weakness.  Endo/Heme/Allergies: Negative.   Psychiatric/Behavioral: Negative.    Blood pressure 149/82, pulse 71, temperature 98.1 F (36.7 C), temperature source Oral, resp. rate 18, height 5' 8.9" (1.75 m), weight 91.672 kg (202 lb 1.6 oz), SpO2 97.00%. Physical Exam  Constitutional: He is oriented to person, place, and time. He appears well-developed and well-nourished. No distress.  HENT:  Head: Normocephalic and atraumatic.  Nose: Nose normal.  Eyes: Conjunctivae and EOM are normal. Pupils are equal, round, and reactive to light. Right eye exhibits no discharge. Left eye exhibits no discharge.  Neck: Normal range of motion. Neck supple. No JVD present. No tracheal deviation present. No thyromegaly present.  Cardiovascular: Normal rate, regular rhythm, normal heart sounds and intact distal pulses.  Exam reveals no gallop.   No murmur heard. Respiratory: Effort normal and breath sounds normal. No respiratory distress. He has no wheezes. He has no rales. He exhibits no tenderness.  GI: Soft. Bowel sounds are normal. He exhibits no distension and no mass. There is no tenderness. There is no rebound and no guarding.  Musculoskeletal: He exhibits no edema and no tenderness.  Lymphadenopathy:    He has no cervical adenopathy.  Neurological: He is alert and oriented to person, place, and time. No cranial nerve deficit.  Skin: He is not diaphoretic.     He is very tender in the indurated area, the area of erythema is not really tender.  Psychiatric: He has a normal mood and affect. His behavior is normal. Judgment and thought content normal.    Assessment/Plan: 1.  Abscess left groin with  erythema/cellulits   2.  Hx of tobacco/ETOH use, none for 7 months 3.  Body mass index is 29.93 kg/(m^2).   Jasan Doughtie 10/29/2013, 9:09 AM

## 2013-10-31 ENCOUNTER — Encounter (HOSPITAL_COMMUNITY): Payer: Self-pay | Admitting: Surgery

## 2013-10-31 DIAGNOSIS — A4902 Methicillin resistant Staphylococcus aureus infection, unspecified site: Secondary | ICD-10-CM

## 2013-10-31 LAB — CBC
HCT: 40.1 % (ref 39.0–52.0)
Hemoglobin: 13.8 g/dL (ref 13.0–17.0)
MCH: 30.2 pg (ref 26.0–34.0)
MCHC: 34.4 g/dL (ref 30.0–36.0)
MCV: 87.7 fL (ref 78.0–100.0)
PLATELETS: 240 10*3/uL (ref 150–400)
RBC: 4.57 MIL/uL (ref 4.22–5.81)
RDW: 12.4 % (ref 11.5–15.5)
WBC: 9.2 10*3/uL (ref 4.0–10.5)

## 2013-10-31 LAB — BASIC METABOLIC PANEL
BUN: 13 mg/dL (ref 6–23)
CALCIUM: 8.7 mg/dL (ref 8.4–10.5)
CHLORIDE: 104 meq/L (ref 96–112)
CO2: 24 mEq/L (ref 19–32)
Creatinine, Ser: 0.8 mg/dL (ref 0.50–1.35)
GFR calc Af Amer: >90 mL/min (ref >90)
GFR calc non Af Amer: >90 mL/min (ref >90)
Glucose, Bld: 89 mg/dL (ref 70–99)
Potassium: 4.2 mEq/L (ref 3.7–5.3)
SODIUM: 140 meq/L (ref 137–147)

## 2013-10-31 NOTE — Care Management Note (Unsigned)
    Page 1 of 1   10/31/2013     12:37:57 PM   CARE MANAGEMENT NOTE 10/31/2013  Patient:  Guy Walker, Guy Walker   Account Number:  [id number]  Date Initiated:  10/31/2013  Documentation initiated by:  [facility]  Subjective/Objective Assessment:   53 year old male admitted with cellulitis.     Action/Plan:   From home.   Anticipated DC Date:  11/03/2013   Anticipated DC Plan:  [place]  CM consult  [facility]      Choice offered to / List presented to:             Status of service:  In process, will continue to follow Medicare Important Message given?  NA - LOS <3 / Initial given by admissions (If response is "NO", the following Medicare IM given date fields will be blank) Date Medicare IM given:   Date Additional Medicare IM given:    Discharge Disposition:    Per UR Regulation:  Reviewed for med. necessity/level of care/duration of stay  If discussed at [place] of Stay Meetings, dates discussed:    Comments:  10/31/13 Allene Dillon RN BSN Met with pt and spouse at bedside and I was accompanied by Almyra Free the [place] interpreter. Pt does not have a PCP and will also need assistance with his medications once d/c'd from the hospital. He qualified for the [facility] program and will be given letter to present to his pharmacy of choice at d/c. I also gave him information to the [facility] and advised him to make an appt at d/c for establishment of care and post hospitalization follow up care.  His wife is willing to learn how to perform the wound care. I spoke to the bedside RN and asked he to begin the teaching once the wound care regimen is established.

## 2013-10-31 NOTE — Progress Notes (Signed)
1 Day Post-Op  Subjective: He felt better till i pulled out some of his packing, still very tender.  I will let him get up and pull out the remainder tomorrow after showering.    Objective: Vital signs in last 24 hours: Temp:  [98 F (36.7 C)-99.4 F (37.4 C)] 98.5 F (36.9 C) (02/13 0526) Pulse Rate:  [58-79] 68 (02/13 0526) Resp:  [16-21] 16 (02/13 0526) BP: (131-167)/(65-87) 131/77 mmHg (02/13 0526) SpO2:  [95 %-99 %] 96 % (02/13 0526) Last BM Date: 10/29/13 Afebrile, VSS, Labs are normal Intake/Output from previous day: 02/12 0701 - 02/13 0700 In: 500 [I.V.:500] Out: 1025 [Urine:1025] Intake/Output this shift:    General appearance: alert, cooperative and no distress Skin: open site looks good, I pulled out about 8 inches of packing. It is bloody purulent looking.  Cellulitis is much better.  Lab Results:   Recent Labs  10/30/13 1900 10/31/13 0530  WBC 7.3 9.2  HGB 14.5 13.8  HCT 40.0 40.1  PLT 237 240    BMET  Recent Labs  10/30/13 0550 10/30/13 1900 10/31/13 0530  NA 140  --  140  K 4.5  --  4.2  CL 106  --  104  CO2 22  --  24  GLUCOSE 108*  --  89  BUN 11  --  13  CREATININE 0.74 0.73 0.80  CALCIUM 8.3*  --  8.7   PT/INR No results found for this basename: LABPROT, INR,  in the last 72 hours   Recent Labs Lab 10/27/13 1235 10/28/13 0505  AST 22 16  ALT 32 25  ALKPHOS 111 101  BILITOT 0.5 0.4  PROT 7.0 6.1  ALBUMIN 3.2* 2.7*     Lipase  No results found for this basename: lipase     Studies/Results: No results found.  Medications: . heparin  5,000 Units Subcutaneous 3 times per day  . vancomycin  1,250 mg Intravenous Q8H    Assessment/Plan MRSA LEFT GROIN ABSCESS/CELLULITIS Incision and drainage of large left medial thigh abscess for culture, 10/30/2013, Dr Johnathan Hausen  Plan:  Continue antibiotics, I will let him shower and pull remaining dressing out tomorrow.  Have some iodoform for wicking site and start teaching  family to do this.  Await culture from yesterday to be sure something other than MRSA is evolved.       LOS: 4 days    Bijan Ridgley 10/31/2013

## 2013-10-31 NOTE — Evaluation (Signed)
Physical Therapy Evaluation Patient Details Name: Guy Walker MRN: [id number] DOB: 10-Apr-1961 Today's Date: 10/31/2013 Time: 3419-6222 PT Time Calculation (min): 15 min  PT Assessment / Plan / Recommendation History of Present Illness  53 yo spanish speaking male admitted with sepsis, L groin abscess. S/P I &D 2/12.   Clinical Impression  On eval, pt required Min assist for mobility-able to ambulate ~135 feet but gait pattern was very slow and antalgic. Feel pt may benefit from single point cane for use during ambulation. Do not anticipate any follow up PT needs. Will follow during stay    PT Assessment  Patient needs continued PT services    Follow Up Recommendations  No PT follow up    Does the patient have the potential to tolerate intense rehabilitation      Barriers to Discharge        Equipment Recommendations  Cane    Recommendations for Other Services     Frequency Min 3X/week    Precautions / Restrictions Precautions Precautions: Fall Precaution Comments: mesh underwear with dressing in place Restrictions Weight Bearing Restrictions: No   Pertinent Vitals/Pain " a little" at rest L groin/thigh area. Increased pain with activity-unrated by patient      Mobility  Bed Mobility Overal bed mobility: Needs Assistance Bed Mobility: Supine to Sit Supine to sit: Min assist;HOB elevated General bed mobility comments: heavy reliance on bedrail. Increased time. assist with trunk to upright. Transfers Overall transfer level: Needs assistance Transfers: Sit to/from Stand Sit to Stand: Min assist General transfer comment: assist to rise, stabilize, control descent.  Ambulation/Gait Ambulation/Gait assistance: Min guard Ambulation Distance (Feet): 135 Feet Assistive device: None Gait Pattern/deviations: Antalgic;Step-to pattern;Step-through pattern;Decreased stride length;Decreased stance time - left;Decreased weight shift to left General Gait Details:  progressed from walker to no assistive device. slow gait speed. no lob Stairs: Yes Stairs assistance: Min guard Stair Management: Forwards;Step to pattern;One rail Left;One rail Right Number of Stairs: 5 General stair comments: VCsafety, sequence. increased time.     Exercises     PT Diagnosis: Difficulty walking;Abnormality of gait;Acute pain  PT Problem List: Decreased range of motion;Decreased activity tolerance;Decreased mobility;Pain;Decreased knowledge of use of DME PT Treatment Interventions: DME instruction;Gait training;Stair training;Functional mobility training;Therapeutic activities;Therapeutic exercise;Patient/family education     PT Goals(Current goals can be found in the care plan section) Acute Rehab PT Goals Patient Stated Goal: less pain.  PT Goal Formulation: With patient Time For Goal Achievement: 11/14/13  Visit Information  Last PT Received On: 10/31/13 Assistance Needed: +1 History of Present Illness: 53 yo spanish speaking male admitted with sepsis, L groin abscess. S/P I &D 2/12.        Prior [place] expects to be discharged to:: Private residence Living Arrangements: Spouse/significant other Available Help at Discharge: Family Home Access: Stairs to enter Technical brewer of Steps: [place]: None Communication Communication: Prefers language other than English    Cognition  Cognition Arousal/Alertness: Awake/alert Behavior During Therapy: WFL for tasks assessed/performed Overall Cognitive Status: Within Functional Limits for tasks assessed    Extremity/Trunk Assessment Upper Extremity Assessment Upper Extremity Assessment: Overall WFL for tasks assessed Lower Extremity Assessment Lower Extremity Assessment: LLE deficits/detail LLE: Unable to fully assess due to pain Cervical / Trunk Assessment Cervical / Trunk Assessment: Normal   Balance    End of Session PT - End of Session Activity  Tolerance: Patient limited by pain Patient left: in chair;with call bell/phone within reach;with family/visitor present  GP  Weston Anna, MPT Pager: [phone]

## 2013-10-31 NOTE — Progress Notes (Signed)
[facility] PROGRESS NOTE  Guy Walker CBJ:[id number] DOB: [birth year] DOA: 10/27/2013  PCP: Does not have a PCP  Brief HPI: 53 y.o. male with no significant PMH who presented with worsening cellulitis of the pelvic region extending to the left thigh area and also associated with fevers. Sx started about 4 days prior to this admission. Pt was seen in urgent care center and required I&D and discharged on doxycycline. Overnight, cellulitis worsened. In the ED, the pt was noted to have a wbc of 13K and was mildly tachycardic. The patient was started on vanc and hospitalist consulted for admission.  Consultants:  General Surgery  Procedures:  Surgical I&D 2/12  Antibiotics: IV Vanc 2/9-->  Subjective: Patient with no new complaints. Pain still persists and worse with movement. Perhaps slightly better.  Objective: Vital Signs  Filed Vitals:   10/30/13 1745 10/30/13 1802 10/31/13 0207 10/31/13 0526  BP:  167/87 131/65 131/77  Pulse:  73 79 68  Temp: 98.9 F (37.2 C)  98.2 F (36.8 C) 98.5 F (36.9 C)  TempSrc:   Oral Oral  Resp:  16 18 16   Height:      Weight:      SpO2:  97% 96% 96%    Intake/Output Summary (Last 24 hours) at 10/31/13 0758 Last data filed at 10/30/13 2025  Gross per 24 hour  Intake    500 ml  Output   1025 ml  Net   -525 ml   Filed Weights   10/27/13 1415 10/29/13 0532  Weight: 93.4 kg (205 lb 14.6 oz) 91.672 kg (202 lb 1.6 oz)    General appearance: alert, cooperative, appears stated age and no distress Resp: clear to auscultation bilaterally Cardio: regular rate and rhythm, S1, S2 normal, no murmur, click, rub or gallop GI: soft, non-tender; bowel sounds normal; no masses,  no organomegaly Extremities: Erythema and warmth noted over left groin and upper thigh medially. Tender to palpation. Packing noted. Erythema seems to be improving. Good ROM left hip and knee Pulses: 2+ and symmetric Skin: erythema and warmth over left  thigh area appears to be better Lymph nodes: Inguinal adenopathy: left Neurologic: No focal deficits.  Lab Results:  Basic Metabolic Panel:  Recent Labs Lab 10/27/13 1235 10/28/13 0505 10/29/13 0520 10/30/13 0550 10/30/13 1900 10/31/13 0530  NA 138 135* 138 140  --  140  K 4.0 3.8 4.2 4.5  --  4.2  CL 103 101 103 106  --  104  CO2 23 22 24 22   --  24  GLUCOSE 127* 116* 117* 108*  --  89  BUN 13 11 12 11   --  13  CREATININE 0.81 0.78 0.81 0.74 0.73 0.80  CALCIUM 9.0 8.1* 8.2* 8.3*  --  8.7   Liver Function Tests:  Recent Labs Lab 10/27/13 1235 10/28/13 0505  AST 22 16  ALT 32 25  ALKPHOS 111 101  BILITOT 0.5 0.4  PROT 7.0 6.1  ALBUMIN 3.2* 2.7*   CBC:  Recent Labs Lab 10/27/13 1235 10/28/13 0505 10/29/13 0520 10/30/13 0550 10/30/13 1900 10/31/13 0530  WBC 13.5* 11.5* 9.2 9.2 7.3 9.2  NEUTROABS 11.0*  --   --   --   --   --   HGB 14.9 13.5 13.8 14.1 14.5 13.8  HCT 43.5 39.2 40.3 40.7 40.0 40.1  MCV 89.9 88.3 88.8 87.5 87.1 87.7  PLT 193 203 213 232 237 240    Recent Results (from the past 240 hour(s))  WOUND  CULTURE     Status: None   Collection Time    10/26/13  2:34 PM      Result Value Ref Range Status   Culture     Final   Value: Abundant METHICILLIN RESISTANT STAPHYLOCOCCUS AUREUS   Gram Stain No WBC Seen   Final   Gram Stain No Squamous Epithelial Cells Seen   Final   Gram Stain Few GRAM POSITIVE COCCI IN PAIRS   Final   Organism ID, Bacteria METHICILLIN RESISTANT STAPHYLOCOCCUS AUREUS   Final   Comment: Rifampin and Gentamicin should not be used as     single drugs for treatment of Staph infections.     This organism DOES NOT demonstrate inducible     Clindamycin resistance in vitro.  WOUND CULTURE     Status: None   Collection Time    10/27/13  2:47 PM      Result Value Ref Range Status   Specimen Description WOUND LEFT GOIN   Final   Special Requests NONE   Final   Gram Stain     Final   Value: ABUNDANT WBC PRESENT,BOTH PMN AND  MONONUCLEAR     NO SQUAMOUS EPITHELIAL CELLS SEEN     MODERATE GRAM POSITIVE COCCI IN CLUSTERS     Performed at [facility]   Culture     Final   Value: MODERATE METHICILLIN RESISTANT STAPHYLOCOCCUS AUREUS     Note: RIFAMPIN AND GENTAMICIN SHOULD NOT BE USED AS SINGLE DRUGS FOR TREATMENT OF STAPH INFECTIONS. This organism DOES NOT demonstrate inducible Clindamycin resistance in vitro. CRITICAL RESULT CALLED TO, READ BACK BY AND VERIFIED WITH: KIRSTIN [email or web address]       ON [place] BY [facility]     Performed at [facility]   Report Status 10/30/2013 FINAL   Final   Organism ID, Bacteria METHICILLIN RESISTANT STAPHYLOCOCCUS AUREUS   Final  CULTURE, BLOOD (ROUTINE X 2)     Status: None   Collection Time    10/27/13  3:00 PM      Result Value Ref Range Status   Specimen Description BLOOD LEFT ANTECUBITAL   Final   Special Requests BOTTLES DRAWN AEROBIC AND ANAEROBIC 5ML   Final   Culture  Setup Time     Final   Value: 10/27/2013 20:02     Performed at 15/05/2014   Culture     Final   Value:        BLOOD CULTURE RECEIVED NO GROWTH TO DATE CULTURE WILL BE HELD FOR 5 DAYS BEFORE ISSUING A FINAL NEGATIVE REPORT     Performed at [facility]   Report Status PENDING   Incomplete  CULTURE, BLOOD (ROUTINE X 2)     Status: None   Collection Time    10/27/13  3:15 PM      Result Value Ref Range Status   Specimen Description BLOOD RIGHT HAND   Final   Special Requests BOTTLES DRAWN AEROBIC AND ANAEROBIC 5ML   Final   Culture  Setup Time     Final   Value: 10/27/2013 20:02     Performed at 15/05/2014   Culture     Final   Value:        BLOOD CULTURE RECEIVED NO GROWTH TO DATE CULTURE WILL BE HELD FOR 5 DAYS BEFORE ISSUING A FINAL NEGATIVE REPORT     Performed at [facility]   Report Status PENDING   Incomplete  CULTURE, ROUTINE-ABSCESS  Status: None   Collection Time    10/30/13  4:56 PM      Result Value Ref Range Status   Specimen Description  ABSCESS LEFT GROIN   Final   Special Requests PATIENT ON FOLLOWING VANCOMYCIN   Final   Gram Stain PENDING   Incomplete   Culture     Final   Value: NO GROWTH 1 DAY     Performed at [facility]   Report Status PENDING   Incomplete      Studies/Results: No results found.  Medications:  Scheduled: . heparin  5,000 Units Subcutaneous 3 times per day  . vancomycin  1,250 mg Intravenous Q8H   Continuous:  [id number], acetaminophen, morphine injection, oxyCODONE-acetaminophen  Assessment/Plan:  Principal Problem:   Sepsis Active Problems:   Cellulitis and abscess   Cellulitis due to MRSA    Cellulitis and abscess Left Thigh and Groin with MRSA He is status post I&D 2/12. Apparently had large pus collection. MRSA noted on wound cultures from few days ago. Continue Vancomycin for now. General surgery is following. WBC has improved. Remains afebrile. Can likely be discharged with oral antibiotics such as Doxycycline whenever he is ready for discharge. Will discuss with surgery.  Sepsis  Resolved. This was mild and secondary to left medial thigh abscess and subsequent cellulitis.  Will involve PT/OT.  Code Status: Full Code  DVT Prophylaxis: Heparin    Family Communication: Discussed with patient and wife. Disposition Plan: Will likely return home when ready. Discharge per surgery input.     LOS: 4 days   [place] [facility] Pager [phone] 10/31/2013, 7:58 AM  If 8PM-8AM, please contact night-coverage at [email or web address], password [facility]

## 2013-11-01 DIAGNOSIS — IMO0002 Reserved for concepts with insufficient information to code with codable children: Secondary | ICD-10-CM | POA: Diagnosis present

## 2013-11-01 LAB — BASIC METABOLIC PANEL
BUN: 14 mg/dL (ref 6–23)
CO2: 26 mEq/L (ref 19–32)
Calcium: 9 mg/dL (ref 8.4–10.5)
Chloride: 104 mEq/L (ref 96–112)
Creatinine, Ser: 0.9 mg/dL (ref 0.50–1.35)
GFR calc non Af Amer: >90 mL/min (ref >90)
GLUCOSE: 104 mg/dL — AB (ref 70–99)
POTASSIUM: 4.4 meq/L (ref 3.7–5.3)
SODIUM: 141 meq/L (ref 137–147)

## 2013-11-01 LAB — CBC
HEMATOCRIT: 41.1 % (ref 39.0–52.0)
HEMOGLOBIN: 14.4 g/dL (ref 13.0–17.0)
MCH: 30.3 pg (ref 26.0–34.0)
MCHC: 35 g/dL (ref 30.0–36.0)
MCV: 86.5 fL (ref 78.0–100.0)
Platelets: 271 10*3/uL (ref 150–400)
RBC: 4.75 MIL/uL (ref 4.22–5.81)
RDW: 12.2 % (ref 11.5–15.5)
WBC: 10.3 10*3/uL (ref 4.0–10.5)

## 2013-11-01 LAB — HIV ANTIBODY (ROUTINE TESTING W REFLEX): HIV: NONREACTIVE

## 2013-11-01 NOTE — Progress Notes (Addendum)
Occupational Therapy Evaluation Patient Details Name: Shaheen Mende MRN: [id number] DOB: January 14, 1961 Today's Date: 11/01/2013 Time: 5053-9767 OT Time Calculation (min): 21 min  OT Assessment / Plan / Recommendation History of present illness 53 yo spanish speaking male admitted with sepsis, L groin abscess. S/P I &D 2/12.    Clinical Impression   Pt requires min A for LB ADL, but family able to provide level of assist. Pt most comfortable in reclined or standing. position. Mod I with bed mobility, transfers and walking @ room.Completed all edcuaiton. No equipment needs. Family verbalized understanding. OT signing off.    OT Assessment  Patient does not need any further OT services    Follow Up Recommendations  Supervision - Intermittent;No OT follow up    Barriers to Discharge      Equipment Recommendations  None recommended by OT    Recommendations for Other Services    Frequency       Precautions / Restrictions Precautions Precautions: None   Pertinent Vitals/Pain 5 groin pain.    ADL  Lower Body Bathing: Minimal assistance Where Assessed - Lower Body Bathing: Unsupported sit to stand Lower Body Dressing: Minimal assistance Where Assessed - Lower Body Dressing: Unsupported sit to stand Toilet Transfer: Supervision/safety Toileting - Clothing Manipulation and Hygiene: Modified independent Where Assessed - Toileting Clothing Manipulation and Hygiene: Sit to stand from 3-in-1 or toilet Equipment Used: Gait belt Transfers/Ambulation Related to ADLs: S ADL Comments: Family will be able to assist as needed for ADL. discussed home safety and reducing risk of infection during bathing.    OT Diagnosis:    OT Problem List:   OT Treatment Interventions:     OT Goals(Current goals can be found in the care plan section) Acute Rehab OT Goals Patient Stated Goal: go home OT Goal Formulation:  (eval only)  Visit Information  Last OT Received On:  11/01/13 Assistance Needed: +1 History of Present Illness: 53 yo [place] speaking male admitted with sepsis, L groin abscess. S/P I &D 2/12.        Prior [place] expects to be discharged to:: Private residence Living Arrangements: Spouse/significant other Available Help at Discharge: Family Type of Home: House Home Access: Stairs to enter Technical brewer of Steps: [place]: One level Home Equipment: None Prior Function Level of Independence: Independent Communication Communication: Prefers language other than English         Vision/Perception     Cognition  Cognition Arousal/Alertness: Awake/alert Behavior During Therapy: WFL for tasks assessed/performed Overall Cognitive Status: Within Functional Limits for tasks assessed    Extremity/Trunk Assessment Upper Extremity Assessment Upper Extremity Assessment: Overall WFL for tasks assessed Lower Extremity Assessment Lower Extremity Assessment: Defer to PT evaluation (limited by pain)     Mobility Bed Mobility Overal bed mobility: Modified Independent Bed Mobility: Supine to Sit;Sit to Supine Transfers Overall transfer level: Modified independent Transfers: Sit to/from Stand Sit to Stand: Modified independent (Device/Increase time)     Exercise     Balance     End of Session OT - End of Session Equipment Utilized During Treatment: Gait belt Activity Tolerance: Patient tolerated treatment well Patient left: in bed;with call bell/phone within reach;with family/visitor present Nurse Communication: Mobility status  GO     Halil Rentz,HILLARY 11/01/2013, 11:10 AM Maurie Boettcher, OTR/L  [phone] 11/01/2013

## 2013-11-01 NOTE — Progress Notes (Signed)
[facility] PROGRESS NOTE  Guy Walker FGH:[id number] DOB: 10/04/1960 DOA: 10/27/2013  PCP: Does not have a PCP  Brief HPI: 53 y.o. male with no significant PMH who presented with worsening cellulitis of the pelvic region extending to the left thigh area and also associated with fevers. Sx started about 4 days prior to this admission. Pt was seen in urgent care center and required I&D and discharged on doxycycline. Overnight, cellulitis worsened. In the ED, the pt was noted to have a wbc of 13K and was mildly tachycardic. The patient was started on vanc and hospitalist consulted for admission.  Consultants:  General Surgery  Procedures:  Surgical I&D 2/12  Antibiotics: IV Vanc 2/9-->  Subjective: Patient feels better. Pain is better. Was able to ambulate yesterday.   Objective: Vital Signs  Filed Vitals:   10/31/13 1331 10/31/13 1847 10/31/13 2200 11/01/13 0600  BP: 123/71 146/76 158/87 125/76  Pulse: 66 71 76 67  Temp: 97.5 F (36.4 C) 98.3 F (36.8 C) 98 F (36.7 C) 98.2 F (36.8 C)  TempSrc: Oral Oral Oral Oral  Resp: 18 20 18 18   Height:      Weight:      SpO2: 97% 93% 94% 98%    Intake/Output Summary (Last 24 hours) at 11/01/13 6789 Last data filed at 10/31/13 1505  Gross per 24 hour  Intake    480 ml  Output    600 ml  Net   -120 ml   Filed Weights   10/27/13 1415 10/29/13 0532  Weight: 93.4 kg (205 lb 14.6 oz) 91.672 kg (202 lb 1.6 oz)    General appearance: alert, cooperative, appears stated age and no distress Resp: clear to auscultation bilaterally Cardio: regular rate and rhythm, S1, S2 normal, no murmur, click, rub or gallop GI: soft, non-tender; bowel sounds normal; no masses,  no organomegaly Extremities: Much improved erythema and warmth over left groin and upper thigh medially. Less tender to palpation. Packing noted. Good ROM left hip and knee Pulses: 2+ and symmetric Skin: erythema and warmth over left thigh area  appears to be much better Lymph nodes: Inguinal adenopathy: left Neurologic: No focal deficits.  Lab Results:  Basic Metabolic Panel:  Recent Labs Lab 10/28/13 0505 10/29/13 0520 10/30/13 0550 10/30/13 1900 10/31/13 0530 11/01/13 0540  NA 135* 138 140  --  140 141  K 3.8 4.2 4.5  --  4.2 4.4  CL 101 103 106  --  104 104  CO2 22 24 22   --  24 26  GLUCOSE 116* 117* 108*  --  89 104*  BUN 11 12 11   --  13 14  CREATININE 0.78 0.81 0.74 0.73 0.80 0.90  CALCIUM 8.1* 8.2* 8.3*  --  8.7 9.0   Liver Function Tests:  Recent Labs Lab 10/27/13 1235 10/28/13 0505  AST 22 16  ALT 32 25  ALKPHOS 111 101  BILITOT 0.5 0.4  PROT 7.0 6.1  ALBUMIN 3.2* 2.7*   CBC:  Recent Labs Lab 10/27/13 1235  10/29/13 0520 10/30/13 0550 10/30/13 1900 10/31/13 0530 11/01/13 0540  WBC 13.5*  < > 9.2 9.2 7.3 9.2 10.3  NEUTROABS 11.0*  --   --   --   --   --   --   HGB 14.9  < > 13.8 14.1 14.5 13.8 14.4  HCT 43.5  < > 40.3 40.7 40.0 40.1 41.1  MCV 89.9  < > 88.8 87.5 87.1 87.7 86.5  PLT 193  < >  213 232 237 240 271  < > = values in this interval not displayed.  Recent Results (from the past 240 hour(s))  WOUND CULTURE     Status: None   Collection Time    10/26/13  2:34 PM      Result Value Ref Range Status   Culture     Final   Value: Abundant METHICILLIN RESISTANT STAPHYLOCOCCUS AUREUS   Gram Stain No WBC Seen   Final   Gram Stain No Squamous Epithelial Cells Seen   Final   Gram Stain Few GRAM POSITIVE COCCI IN PAIRS   Final   Organism ID, Bacteria METHICILLIN RESISTANT STAPHYLOCOCCUS AUREUS   Final   Comment: Rifampin and Gentamicin should not be used as     single drugs for treatment of Staph infections.     This organism DOES NOT demonstrate inducible     Clindamycin resistance in vitro.  WOUND CULTURE     Status: None   Collection Time    10/27/13  2:47 PM      Result Value Ref Range Status   Specimen Description WOUND LEFT GOIN   Final   Special Requests NONE   Final    Gram Stain     Final   Value: ABUNDANT WBC PRESENT,BOTH PMN AND MONONUCLEAR     NO SQUAMOUS EPITHELIAL CELLS SEEN     MODERATE GRAM POSITIVE COCCI IN CLUSTERS     Performed at [facility]   Culture     Final   Value: MODERATE METHICILLIN RESISTANT STAPHYLOCOCCUS AUREUS     Note: RIFAMPIN AND GENTAMICIN SHOULD NOT BE USED AS SINGLE DRUGS FOR TREATMENT OF STAPH INFECTIONS. This organism DOES NOT demonstrate inducible Clindamycin resistance in vitro. CRITICAL RESULT CALLED TO, READ BACK BY AND VERIFIED WITH: KIRSTIN [email or web address]       ON Alesia Banda BY [facility]     Performed at [facility]   Report Status 10/30/2013 FINAL   Final   Organism ID, Bacteria METHICILLIN RESISTANT STAPHYLOCOCCUS AUREUS   Final  CULTURE, BLOOD (ROUTINE X 2)     Status: None   Collection Time    10/27/13  3:00 PM      Result Value Ref Range Status   Specimen Description BLOOD LEFT ANTECUBITAL   Final   Special Requests BOTTLES DRAWN AEROBIC AND ANAEROBIC 5ML   Final   Culture  Setup Time     Final   Value: 10/27/2013 20:02     Performed at 15/05/2014   Culture     Final   Value:        BLOOD CULTURE RECEIVED NO GROWTH TO DATE CULTURE WILL BE HELD FOR 5 DAYS BEFORE ISSUING A FINAL NEGATIVE REPORT     Performed at [facility]   Report Status PENDING   Incomplete  CULTURE, BLOOD (ROUTINE X 2)     Status: None   Collection Time    10/27/13  3:15 PM      Result Value Ref Range Status   Specimen Description BLOOD RIGHT HAND   Final   Special Requests BOTTLES DRAWN AEROBIC AND ANAEROBIC 5ML   Final   Culture  Setup Time     Final   Value: 10/27/2013 20:02     Performed at 15/05/2014   Culture     Final   Value:        BLOOD CULTURE RECEIVED NO GROWTH TO DATE CULTURE WILL BE HELD FOR 5 DAYS BEFORE ISSUING A  FINAL NEGATIVE REPORT     Performed at [facility]   Report Status PENDING   Incomplete  CULTURE, ROUTINE-ABSCESS     Status: None   Collection Time    10/30/13   4:56 PM      Result Value Ref Range Status   Specimen Description ABSCESS LEFT GROIN   Final   Special Requests PATIENT ON FOLLOWING VANCOMYCIN   Final   Gram Stain     Final   Value: FEW WBC PRESENT, PREDOMINANTLY PMN     NO SQUAMOUS EPITHELIAL CELLS SEEN     RARE GRAM POSITIVE COCCI     IN PAIRS     Performed at [facility]   Culture     Final   Value: MODERATE STAPHYLOCOCCUS AUREUS     Note: RIFAMPIN AND GENTAMICIN SHOULD NOT BE USED AS SINGLE DRUGS FOR TREATMENT OF STAPH INFECTIONS.     Performed at [facility]   Report Status PENDING   Incomplete  ANAEROBIC CULTURE     Status: None   Collection Time    10/30/13  4:56 PM      Result Value Ref Range Status   Specimen Description ABSCESS LEFT GROIN   Final   Special Requests PATIENT ON FOLLOWING VANCOMYCIN   Final   Gram Stain     Final   Value: FEW WBC PRESENT, PREDOMINANTLY PMN     NO SQUAMOUS EPITHELIAL CELLS SEEN     RARE GRAM POSITIVE COCCI     IN PAIRS     Performed at [facility]   Culture     Final   Value: NO ANAEROBES ISOLATED; CULTURE IN PROGRESS FOR 5 DAYS     Performed at [facility]   Report Status PENDING   Incomplete      Studies/Results: No results found.  Medications:  Scheduled: . heparin  5,000 Units Subcutaneous 3 times per day  . vancomycin  1,250 mg Intravenous Q8H   Continuous:  AYT:[id number], acetaminophen, morphine injection, oxyCODONE-acetaminophen  Assessment/Plan:  Principal Problem:   Abscess & cellulitis of thigh s/p I&D [id number] Active Problems:   Sepsis   Cellulitis due to MRSA   Cellulitis and abscess Left Thigh and Groin with MRSA He is status post I&D 2/12. Apparently had large pus collection. MRSA noted on wound cultures from few days ago. Staph noted in most recent cultures as well. Continue Vancomycin for now. General surgery is following. WBC has improved. Remains afebrile. Can likely be discharged with oral antibiotics such as  Doxycycline whenever he is ready for discharge. Anticipate discharge in AM if ok with surgery.  Sepsis  Resolved. This was mild and secondary to left medial thigh abscess and subsequent cellulitis.  Needs cane which will be ordered.  Code Status: Full Code  DVT Prophylaxis: Heparin    Family Communication: Discussed with patient. Disposition Plan: Discharge per surgery. Possibly in AM.    LOS: 5 days   [place] [facility] Pager [phone] 11/01/2013, 8:38 AM  If 8PM-8AM, please contact night-coverage at [email or web address], password [facility]

## 2013-11-01 NOTE — Progress Notes (Signed)
Physical Therapy Treatment Patient Details Name: Parnell Spieler MRN: [id number] DOB: 02-09-1961 Today's Date: 11/01/2013 Time: 0310-0330 PT Time Calculation (min): 20 min  PT Assessment / Plan / Recommendation  History of Present Illness 53 yo spanish speaking male admitted with sepsis, L groin abscess. S/P I &D 2/12.    PT Comments   Pt able to ambulate with improved balance today without ad and managed stairs with S.  All PT education complete and will d/c from PT.  Follow Up Recommendations  No PT follow up     Does the patient have the potential to tolerate intense rehabilitation     Barriers to Discharge        Equipment Recommendations  None recommended by PT    Recommendations for Other Services    Frequency     Progress towards PT Goals Progress towards PT goals: Goals met/education completed, patient discharged from PT  Plan Current plan remains appropriate    Precautions / Restrictions Precautions Precaution Comments: mesh underwear with dressing in place   Pertinent Vitals/Pain No c/o pain.    Mobility  Bed Mobility Overal bed mobility: Modified Independent Transfers Overall transfer level: Modified independent Transfers: Sit to/from Stand Sit to Stand: Modified independent (Device/Increase time) Ambulation/Gait Ambulation/Gait assistance: Supervision;Modified independent (Device/Increase time) Ambulation Distance (Feet): 300 Feet Assistive device: None (tried cane, but didn't like it) Gait Pattern/deviations: Step-through pattern;WFL(Within Functional Limits) General Gait Details: pt did well with gait.  Amb backwards in room. Stairs: Yes Stairs assistance: Supervision Stair Management: One rail Left;No rails;One rail Right;Forwards Number of Stairs: 6 General stair comments: cues for technique    Exercises     PT Diagnosis:    PT Problem List:   PT Treatment Interventions:     PT Goals (current goals can now be found in the care plan  section) Acute Rehab PT Goals Patient Stated Goal: go home  Visit Information  Last PT Received On: 11/01/13 Assistance Needed: +1 History of Present Illness: 53 yo [place] speaking male admitted with sepsis, L groin abscess. S/P I &D 2/12.     Subjective Data  Subjective: family present Patient Stated Goal: go home   Cognition  Cognition Arousal/Alertness: Awake/alert Behavior During Therapy: WFL for tasks assessed/performed Overall Cognitive Status: Within Functional Limits for tasks assessed    Balance     End of Session PT - End of Session Equipment Utilized During Treatment: Gait belt Activity Tolerance: Patient tolerated treatment well Patient left: in chair;with call bell/phone within reach;with family/visitor present   GP     [facility] 11/01/2013, 4:34 PM

## 2013-11-01 NOTE — Progress Notes (Signed)
2 Days Post-Op  Subjective: He is feeling better.  Pain better.  Objective: Vital signs in last 24 hours: Temp:  [97.5 F (36.4 C)-98.7 F (37.1 C)] 98.2 F (36.8 C) (02/14 0600) Pulse Rate:  [66-76] 67 (02/14 0600) Resp:  [18-20] 18 (02/14 0600) BP: (123-158)/(71-87) 125/76 mmHg (02/14 0600) SpO2:  [93 %-98 %] 98 % (02/14 0600) Last BM Date: 10/29/13 Afebrile, VSS, Labs are normal Intake/Output from previous day: 02/13 0701 - 02/14 0700 In: 480 [P.O.:480] Out: 600 [Urine:600] Intake/Output this shift:   General appearance: alert, cooperative and no distress Skin: open site looks good, still with some induration.  Lab Results:   Recent Labs  10/31/13 0530 11/01/13 0540  WBC 9.2 10.3  HGB 13.8 14.4  HCT 40.1 41.1  PLT 240 271    BMET  Recent Labs  10/31/13 0530 11/01/13 0540  NA 140 141  K 4.2 4.4  CL 104 104  CO2 24 26  GLUCOSE 89 104*  BUN 13 14  CREATININE 0.80 0.90  CALCIUM 8.7 9.0   PT/INR No results found for this basename: LABPROT, INR,  in the last 72 hours   Recent Labs Lab 10/27/13 1235 10/28/13 0505  AST 22 16  ALT 32 25  ALKPHOS 111 101  BILITOT 0.5 0.4  PROT 7.0 6.1  ALBUMIN 3.2* 2.7*     Lipase  No results found for this basename: lipase     Studies/Results: No results found.  Medications: . heparin  5,000 Units Subcutaneous 3 times per day  . vancomycin  1,250 mg Intravenous Q8H    Assessment/Plan MRSA LEFT GROIN ABSCESS/CELLULITIS Incision and drainage of large left medial thigh abscess for culture, 10/30/2013, Dr Johnathan Hausen  Plan:  Continue antibiotics.  Ok to shower.  Change dressing daily.  Have some iodoform for wicking site and start teaching family to do this.  Antibiotics per primary team.       LOS: 5 days    Djuan Talton C. 09/19/5850

## 2013-11-02 LAB — CULTURE, ROUTINE-ABSCESS

## 2013-11-02 LAB — CULTURE, BLOOD (ROUTINE X 2)
Culture: NO GROWTH
Culture: NO GROWTH

## 2013-11-02 MED ORDER — DOXYCYCLINE HYCLATE 100 MG PO TABS: 100.0000 mg | ORAL_TABLET | Freq: Two times a day (BID) | ORAL | Status: DC

## 2013-11-02 MED ORDER — DOXYCYCLINE HYCLATE 100 MG PO TABS
100.0000 mg | ORAL_TABLET | Freq: Two times a day (BID) | ORAL | Status: DC
  Administered 2013-11-02: 100 mg via ORAL
  Filled 2013-11-02 (×3): qty 1

## 2013-11-02 MED ORDER — OXYCODONE-ACETAMINOPHEN 5-325 MG PO TABS: 1.0000 | ORAL_TABLET | Freq: Three times a day (TID) | ORAL | Status: DC | PRN

## 2013-11-02 NOTE — Progress Notes (Signed)
11/02/2013 1555 No assistance with meds requested at time of dc. Pt had doxycline and med is $40 at [facility] and $42 at [facility]. Jonnie Finner RN CCM Case Mgmt phone [phone]

## 2013-11-02 NOTE — Progress Notes (Signed)
3 Days Post-Op  Subjective: He is feeling better again.  Dressing changes ok Objective: Vital signs in last 24 hours: Temp:  [97.8 F (36.6 C)-99.3 F (37.4 C)] 97.8 F (36.6 C) (02/15 0636) Pulse Rate:  [57-80] 57 (02/15 0636) Resp:  [18] 18 (02/15 0636) BP: (127-142)/(72-78) 127/73 mmHg (02/15 0636) SpO2:  [96 %-98 %] 96 % (02/15 0636) Last BM Date: 10/29/13 Afebrile, VSS, Labs are normal Intake/Output from previous day: 02/14 0701 - 02/15 0700 In: 2500 [IV Piggyback:2500] Out: 500 [Urine:500] Intake/Output this shift:   General appearance: alert, cooperative and no distress Skin: open site looks good, induration better Lab Results:   Recent Labs  10/31/13 0530 11/01/13 0540  WBC 9.2 10.3  HGB 13.8 14.4  HCT 40.1 41.1  PLT 240 271    BMET  Recent Labs  10/31/13 0530 11/01/13 0540  NA 140 141  K 4.2 4.4  CL 104 104  CO2 24 26  GLUCOSE 89 104*  BUN 13 14  CREATININE 0.80 0.90  CALCIUM 8.7 9.0   PT/INR No results found for this basename: LABPROT, INR,  in the last 72 hours   Recent Labs Lab 10/27/13 1235 10/28/13 0505  AST 22 16  ALT 32 25  ALKPHOS 111 101  BILITOT 0.5 0.4  PROT 7.0 6.1  ALBUMIN 3.2* 2.7*     Lipase  No results found for this basename: lipase     Studies/Results: No results found.  Medications: . heparin  5,000 Units Subcutaneous 3 times per day  . vancomycin  1,250 mg Intravenous Q8H    Assessment/Plan MRSA LEFT GROIN ABSCESS/CELLULITIS Incision and drainage of large left medial thigh abscess for culture, 10/30/2013, Dr Johnathan Hausen  Plan:   Change dressing daily.  Teach family how to pack wound.  Antibiotics per primary team. Ok to d/c from our standpoint.  F/U with Dr Hassell Done in 2 weeks.      LOS: 6 days    Aime Carreras C. 04/02/9677

## 2013-11-02 NOTE — Discharge Summary (Signed)
[facility]  Physician Discharge Summary   Patient ID: Guy Walker MRN: [id number] DOB/AGE: Feb 05, 1961 53 y.o.  Admit date: 10/27/2013 Discharge date: 11/02/2013  YIR:SWNI  DISCHARGE DIAGNOSES:  Principal Problem:   Abscess & cellulitis of thigh s/p I&D [id number] Active Problems:   Sepsis   Cellulitis due to MRSA   RECOMMENDATIONS FOR OUTPATIENT FOLLOW UP: 1. Dressing changes as per surgeon  DISCHARGE CONDITION: good  Diet recommendation: Regular  Filed Weights   10/27/13 1415 10/29/13 0532  Weight: 93.4 kg (205 lb 14.6 oz) 91.672 kg (202 lb 1.6 oz)    INITIAL HISTORY: 53 y.o. male with no significant PMH who presented with worsening cellulitis of the pelvic region extending to the left thigh area and also associated with fevers. Sx started about 4 days prior to this admission. Pt was seen in urgent care center and required I&D and discharged on doxycycline. Overnight, cellulitis worsened. In the ED, the pt was noted to have a wbc of 13K and was mildly tachycardic. The patient was started on vanc and hospitalist consulted for admission.   Consultations:  General Surgery  Procedures:  Surgical I&D 2/12 by Dr. Sandi Raveling COURSE:   Cellulitis and abscess Left Thigh and Groin with MRSA  He is status post I&D in ED on 2/8 and then by surgeon on 2/12. Apparently had large pus collection. MRSA noted on wound cultures from 10/26/13. Staph noted in most recent cultures as well. Blood cultures were negative. He was initially placed on Vancomycin. He was slow to improve but now is much better. Erythema has improved. Pain is much better as well. He will do dressing changes at home. He will be given a letter to stay off work. He will follow up with Dr. Hassell Done next week. He will be discharged on oral Doxycyline.  Sepsis  Resolved. This was mild and secondary to left medial thigh abscess and subsequent cellulitis.   He was seen by PT due to pain with  ambulation. He needs cane which was ordered. He has been ambulating in hallway.  He is medically stable for discharge  PERTINENT LABS:  The results of significant diagnostics from this hospitalization (including imaging, microbiology, ancillary and laboratory) are listed below for reference.    Microbiology: Recent Results (from the past 240 hour(s))  WOUND CULTURE     Status: None   Collection Time    10/26/13  2:34 PM      Result Value Ref Range Status   Culture     Final   Value: Abundant METHICILLIN RESISTANT STAPHYLOCOCCUS AUREUS   Gram Stain No WBC Seen   Final   Gram Stain No Squamous Epithelial Cells Seen   Final   Gram Stain Few GRAM POSITIVE COCCI IN PAIRS   Final   Organism ID, Bacteria METHICILLIN RESISTANT STAPHYLOCOCCUS AUREUS   Final   Comment: Rifampin and Gentamicin should not be used as     single drugs for treatment of Staph infections.     This organism DOES NOT demonstrate inducible     Clindamycin resistance in vitro.  WOUND CULTURE     Status: None   Collection Time    10/27/13  2:47 PM      Result Value Ref Range Status   Specimen Description WOUND LEFT GOIN   Final   Special Requests NONE   Final   Gram Stain     Final   Value: ABUNDANT WBC PRESENT,BOTH PMN AND MONONUCLEAR     NO SQUAMOUS EPITHELIAL  CELLS SEEN     MODERATE GRAM POSITIVE COCCI IN CLUSTERS     Performed at [facility]   Culture     Final   Value: MODERATE METHICILLIN RESISTANT STAPHYLOCOCCUS AUREUS     Note: RIFAMPIN AND GENTAMICIN SHOULD NOT BE USED AS SINGLE DRUGS FOR TREATMENT OF STAPH INFECTIONS. This organism DOES NOT demonstrate inducible Clindamycin resistance in vitro. CRITICAL RESULT CALLED TO, READ BACK BY AND VERIFIED WITH: KIRSTIN RYAN@[email or web address]       ON [id number] BY [facility]     Performed at [facility]   Report Status 10/30/2013 FINAL   Final   Organism ID, Bacteria METHICILLIN RESISTANT STAPHYLOCOCCUS AUREUS   Final  CULTURE, BLOOD (ROUTINE X 2)     Status: None    Collection Time    10/27/13  3:00 PM      Result Value Ref Range Status   Specimen Description BLOOD LEFT ANTECUBITAL   Final   Special Requests BOTTLES DRAWN AEROBIC AND ANAEROBIC 5ML   Final   Culture  Setup Time     Final   Value: 10/27/2013 20:02     Performed at [facility]   Culture     Final   Value:        BLOOD CULTURE RECEIVED NO GROWTH TO DATE CULTURE WILL BE HELD FOR 5 DAYS BEFORE ISSUING A FINAL NEGATIVE REPORT     Performed at [facility]   Report Status PENDING   Incomplete  CULTURE, BLOOD (ROUTINE X 2)     Status: None   Collection Time    10/27/13  3:15 PM      Result Value Ref Range Status   Specimen Description BLOOD RIGHT HAND   Final   Special Requests BOTTLES DRAWN AEROBIC AND ANAEROBIC 5ML   Final   Culture  Setup Time     Final   Value: 10/27/2013 20:02     Performed at [facility]   Culture     Final   Value:        BLOOD CULTURE RECEIVED NO GROWTH TO DATE CULTURE WILL BE HELD FOR 5 DAYS BEFORE ISSUING A FINAL NEGATIVE REPORT     Performed at [facility]   Report Status PENDING   Incomplete  CULTURE, ROUTINE-ABSCESS     Status: None   Collection Time    10/30/13  4:56 PM      Result Value Ref Range Status   Specimen Description ABSCESS LEFT GROIN   Final   Special Requests PATIENT ON FOLLOWING VANCOMYCIN   Final   Gram Stain     Final   Value: FEW WBC PRESENT, PREDOMINANTLY PMN     NO SQUAMOUS EPITHELIAL CELLS SEEN     RARE GRAM POSITIVE COCCI     IN PAIRS     Performed at [facility]   Culture     Final   Value: MODERATE METHICILLIN RESISTANT STAPHYLOCOCCUS AUREUS     Note: RIFAMPIN AND GENTAMICIN SHOULD NOT BE USED AS SINGLE DRUGS FOR TREATMENT OF STAPH INFECTIONS. This organism DOES NOT demonstrate inducible Clindamycin resistance in vitro. CRITICAL RESULT CALLED TO, READ BACK BY AND VERIFIED WITH: SHAWN S 2/15      @1020  BY REAMM     Performed at [facility]   Report Status 11/02/2013 FINAL    Final   Organism ID, Bacteria METHICILLIN RESISTANT STAPHYLOCOCCUS AUREUS   Final  ANAEROBIC CULTURE     Status: None  Collection Time    10/30/13  4:56 PM      Result Value Ref Range Status   Specimen Description ABSCESS LEFT GROIN   Final   Special Requests PATIENT ON FOLLOWING VANCOMYCIN   Final   Gram Stain     Final   Value: FEW WBC PRESENT, PREDOMINANTLY PMN     NO SQUAMOUS EPITHELIAL CELLS SEEN     RARE GRAM POSITIVE COCCI     IN PAIRS     Performed at [facility]   Culture     Final   Value: NO ANAEROBES ISOLATED; CULTURE IN PROGRESS FOR 5 DAYS     Performed at [facility]   Report Status PENDING   Incomplete     Labs: Basic Metabolic Panel:  Recent Labs Lab 10/28/13 0505 10/29/13 0520 10/30/13 0550 10/30/13 1900 10/31/13 0530 11/01/13 0540  NA 135* 138 140  --  140 141  K 3.8 4.2 4.5  --  4.2 4.4  CL 101 103 106  --  104 104  CO2 22 24 22   --  24 26  GLUCOSE 116* 117* 108*  --  89 104*  BUN 11 12 11   --  13 14  CREATININE 0.78 0.81 0.74 0.73 0.80 0.90  CALCIUM 8.1* 8.2* 8.3*  --  8.7 9.0   Liver Function Tests:  Recent Labs Lab 10/27/13 1235 10/28/13 0505  AST 22 16  ALT 32 25  ALKPHOS 111 101  BILITOT 0.5 0.4  PROT 7.0 6.1  ALBUMIN 3.2* 2.7*   CBC:  Recent Labs Lab 10/27/13 1235  10/29/13 0520 10/30/13 0550 10/30/13 1900 10/31/13 0530 11/01/13 0540  WBC 13.5*  < > 9.2 9.2 7.3 9.2 10.3  NEUTROABS 11.0*  --   --   --   --   --   --   HGB 14.9  < > 13.8 14.1 14.5 13.8 14.4  HCT 43.5  < > 40.3 40.7 40.0 40.1 41.1  MCV 89.9  < > 88.8 87.5 87.1 87.7 86.5  PLT 193  < > 213 232 237 240 271  < > = values in this interval not displayed.   IMAGING STUDIES No results found.  DISCHARGE EXAMINATION: Filed Vitals:   11/01/13 1347 11/01/13 1835 11/01/13 2214 11/02/13 0636  BP: 136/74 142/72 136/78 127/73  Pulse: 68 80 66 57  Temp: 99.3 F (37.4 C) 98.4 F (36.9 C) 98.6 F (37 C) 97.8 F (36.6 C)  TempSrc: Oral Oral Oral  Oral  Resp: 18  18 18   Height:      Weight:      SpO2: 97% 98% 97% 96%   General appearance: alert, cooperative, appears stated age and no distress Resp: clear to auscultation bilaterally Cardio: regular rate and rhythm, S1, S2 normal, no murmur, click, rub or gallop GI: soft, non-tender; bowel sounds normal; no masses,  no organomegaly Extremities: much improved swelling and erythema in left medial thigh.  DISPOSITION: Home  Discharge Orders   Future Orders Complete By Expires   Diet general  As directed    Discharge instructions  As directed    Comments:     Change dressing and packing as instructed by surgeon. Seek attention if redness and pain gets worse.   Increase activity slowly  As directed       ALLERGIES: No Known Allergies  Current Discharge Medication List    START taking these medications   Details  !! doxycycline (VIBRA-TABS) 100 MG tablet Take 1  tablet (100 mg total) by mouth every 12 (twelve) hours. For 5 more days after you finish with the earlier prescribed amount. Qty: 10 tablet, Refills: 0     !! - Potential duplicate medications found. Please discuss with provider.    CONTINUE these medications which have CHANGED   Details  oxyCODONE-acetaminophen (ROXICET) 5-325 MG per tablet Take 1 tablet by mouth every 8 (eight) hours as needed for severe pain. Qty: 15 tablet, Refills: 0   Associated Diagnoses: Cellulitis and abscess; Screening for diabetes mellitus      CONTINUE these medications which have NOT CHANGED   Details  !! doxycycline (VIBRA-TABS) 100 MG tablet Take 1 tablet (100 mg total) by mouth 2 (two) times daily. Qty: 20 tablet, Refills: 0   Associated Diagnoses: Cellulitis and abscess     !! - Potential duplicate medications found. Please discuss with provider.     Follow-up Information   Follow up with Johnathan Hausen B, MD. Schedule an appointment as soon as possible for a visit in 10 days.   Specialty:  General Surgery   Contact  information:   [address] [place] [address] [phone]       TOTAL DISCHARGE TIME: 35 mins  [facility] Pager [phone]  11/02/2013, 10:55 AM

## 2013-11-02 NOTE — Discharge Instructions (Addendum)
Absceso (Abscess)  Un absceso es una zona infectada que contiene pus y desechos.Puede aparecer en cualquier parte del cuerpo. Tambin se lo conoce como fornculo o divieso. CAUSAS  Ocurre cuando los tejidos se infectan. Tambin puede formarse por obstruccin de las glndulas sebceas o las glndulas sudorparas, infeccin de los folculos pilosos o por una lesin pequea en la piel. A medida que el organismo lucha contra la infeccin, se acumula pus en la zona y hace presin debajo de la piel. Esta presin causa dolor. Las personas con un sistema inmunolgico debilitado tienen dificultad para Industrial/product designer las infecciones y pueden formar abscesos con ms frecuencia.  SNTOMAS  Generalmente un absceso se forma sobre la piel y se vuelve una masa dolorosa, roja, caliente y sensible. Si se forma debajo de la piel, podr sentir como una zona blanda, que se [place], debajo de la piel. Algunos abscesos se abren (ruptura) por s mismos, pero la mayora seguir empeorando si no se lo trata. La infeccin puede diseminarse hacia otros sitios del cuerpo y finalmente al torrente sanguneo y hace que el enfermo se sienta mal.  DIAGNSTICO  El mdico le har una historia clnica y un examen fsico. Podrn tomarle Lauris Poag de lquido del absceso y Public librarian para Clinical research associate la causa de la infeccin. .  TRATAMIENTO  El mdico le indicar antibiticos para combatir la infeccin. Sin embargo, el uso de antibiticos solamente no curar el absceso. El mdico tendr que hacer un pequeo corte (incisin) en el absceso para drenar el pus. En algunos casos se introduce una gasa en el absceso para reducir Chief Technology Officer y que siga drenando la zona.  INSTRUCCIONES PARA EL CUIDADO EN EL HOGAR   Solo tome medicamentos de venta libre o recetados para Chief Technology Officer, Dentist o fiebre, segn las indicaciones del mdico.  Si le han recetado antibiticos, tmelos segn las indicaciones. Tmelos todos, aunque se sienta mejor.  Si le aplicaron  una gasa, siga las indicaciones del mdico para [place].  Para evitar la propagacin de la infeccin:  Mantenga el absceso cubierto con el vendaje.  Lvese bien las manos.  No comparta artculos de cuidado personal, toallas o jacuzzis con los dems.  Evite el contacto con la piel de Economist.  Mantenga la piel y la ropa limpia alrededor del absceso.  Cumpla con todas las visitas de control, segn le indique su mdico. SOLICITE ATENCIN MDICA SI:   Aumenta el dolor, la hinchazn, el enrojecimiento, drena lquido o sangra.  Siente dolores musculares, escalofros, o una sensacin general de Dentist.  Tiene fiebre. ASEGRESE DE QUE:   Comprende estas instrucciones.  Controlar su enfermedad.  Solicitar ayuda de inmediato si no mejora o si empeora. Document Released: 09/04/2005 Document Revised: 03/05/2012 [facility] Patient Information 2014 [place], [place].  Absceso - Cuidados posteriores  (Abscess, Care After) Un absceso (tambin llamado divieso o fornculo) es una zona infectada que contiene pus. Los signos y los sntomas de un absceso Environmental education officer, sensibilidad, enrojecimiento o Civil engineer, contracting, o puede sentir una zona blanda que se desplaza debajo de la piel. Un absceso puede presentarse en cualquier parte del cuerpo. La infeccin puede extenderse a los tejidos circundantes y causar celulitis. El cirujano le ha practicado un corte (incisin) sobre el absceso y el pus ha drenado. En ese espacio le han colocado una gasa para facilitar el drenaje que permitir que la cavidad se cure desde Geophysical data processor exterior. El absceso puede doler durante 5 a [address]. La mayor parte de las personas no  presentan fiebre. Si el absceso fue controlado en una etapa temprana, puede que no est localizado y entonces es posible que no lo drenen. En este caso, en necesario que programe otra cita si no mejora por s mismo o con medicacin.  INSTRUCCIONES PARA EL CUIDADO EN EL HOGAR   Solo tome  medicamentos de venta libre o recetados para Chief Technology Officerel dolor, Dentistmalestar o la fiebre, segn las indicaciones del mdico.  En el momento del bao, remoje y Engineer, miningluego retire la gasa o la compresa con yodoformo, al Huntsman Corporationmenos todos los das o segn le haya indicado el profesional que lo asiste. Luego puede lavar la herida suavemente con [place] jabonosa. Luego vuelva a Astronomercolocar la gasa segn se lo haya indicado el profesional que lo asiste. SOLICITE ATENCIN MDICA DE INMEDIATO SI:   Presenta dolor intenso, hinchazn, enrojecimiento, drena lquido o sangra en la regin de la herida.  Aparecen signos de infeccin generalizada, incluyendo dolores musculares, escalofros, fiebre, o una sensacin general de Dentistmalestar.  La temperatura oral le sube a ms de 38,9 C (102 F) y no puede controlarla con medicamentos. Consulte al mdico para un nuevo control o en caso que presente alguno de los sntomas descritos ms [place]. Si le recetan medicamentos (antibiticos), tmelos tal como se le indic.  Document Released: 08/21/2012 [facility] Patient Information 2014 [place], [place].  SAMR resumen (MRSA Overview) Las siglas SARM significan staphylococcus aureus resistente a la meticilina. Se trata de un tipo de bacteria resistente a algunos antibiticos que puede causar infecciones en la piel y otras zonas. El staphylococcus aureus es una bacteria que vive normalmente en la piel o en la Darene Lamernariz. Los estafilococos que se encuentran sobre la piel o en la nariz no causan problemas. Sin embargo, si ingresa al organismo a travs de un corte o una herida, puede producir una infeccin.  Hasta no hace mucho tiempo, las infecciones por estafilococo tipo MRSA ocurran en hospitales y otros establecimientos sanitarios. Ahora se observa que causa infecciones en toda la comunidad. El SMRA ocasiona problemas de salud graves que pueden llevar a la Jacksonmuerte. La mayora de estas infecciones se adquieren de una de las siguientes formas:  SAMR  hospitalaria  Pueden contraerla todas las personas en cualquier estado de Wap[place]osalud. El MRSA puede ser un gran problema en las personas hospitalizadas, los que se encuentran en hogares para ancianos, las personas que concurren a establecimientos para rehabilitacin, Dealerpersonas con deficiencias en el sistema inmunolgico, pacientes en dilisis y aquellos que han sido sometidos a [place]una ciruga.  SAMR relacionada con la comunidad  Esta forma de transmisin dentro de la comunidad se vuelve cada vez ms frecuente. Se sabe que se contagia en lugares en los que hay mucha gente, en crceles y prisiones, y en situaciones en las que se producen contactos piel a piel, como durante eventos deportivos o en vestuarios. Puede contagiarse a travs de utensilios compartidos, como rasuradotas, toallas o equipos deportivos. CAUSAS Safeco Corporationodos los estafilococos, incluyendo el SAMR normalmente son inocuos excepto que puedan entrar al torrente sanguneo a travs de un rasguo, un corte o una herida como la de [place] Herz[place]ovinauna ciruga. Safeco Corporationodos los estafilococos, incluyendo el SAMR pueden contagiarse de Neomia Dearuna persona a otra tocando objetos contaminados as como a travs del Chemical engineercontacto directo. GRUPOS ESPECIALES Esta enfermedad puede ser un trastorno para algunos grupos especiales. Estos grupos son:  Beacher MayMujeres que amamantan  El problema ms frecuente es la infeccin de la mama (mastitis). Hay pruebas de que esta enfermedad puede transmitirse a un beb a travs de  la [employer]. El mdico podr recomendarle suspender la lactancia materna hasta que la mastitis est bajo control.  Si est amamantando y sufre este tipo de infeccin en otro lugar que no sea la mama, podr continuar amamantando mientras se Production designer, theatre/television/film. Si toma antibiticos, consulte con su mdico si es seguro continuar con la lactancia materna mientras toma sus medicamentos.  Neonatos (bebs entre el nacimiento y un mes de vida) y los bebs (entre un mes de vida a un  ao de edad).  Hay pruebas de que esta enfermedad puede transmitirse a un beb en el momento de nacer si la madre lo tiene en la piel o en el canal de parto, o sufre la infeccin en el tero, el cuello del tero o la vagina. Esta infeccin puede asemejarse a una erupcin normal en el recin nacido o a otras infecciones de la piel. Esto hace difcil el diagnstiico.  Individuos con su sistema inmunolgico comprometido.  Si tiene un problema con el sistema inmunolgico, tiene ms probabilidades de Forensic scientist tipo de infeccin.  Cleora Fleet persona luego de cualquier tipo de [place].  Los estafilococos en general, incluyendo el SAMR son la causa ms frecuente de infecciones que se producen en el lugar de una ciruga reciente.  Personas que toman corticoides por largos perodos.  Este tipo de medicamentos disminuye la resistencia a las infecciones. Esto aumenta las probabilidades de contraer esta enfermedad.  Los que has sido hospitalizados con frecuencia, los que viven en hogares para ancianos u en otros establecimientos residenciales, los que tienen un catter venoso o urinario o han recibido mltiples tratamientos con antibiticos por Runner, broadcasting/film/video. DIAGNSTICO El diagnstico se realiza a travs de cultivos o pruebas especiales de [place] de fluidos provenientes de:  Hisopado tomado de los cortes o heridas en las zonas infectadas.  Hisopados nasales.  Saliva o mucus proveniente de los pulmones (esputo).  Orina.  Sangre. Algunas personas son portadores, pero no presentan signos de infeccin. Esto significa que llevan el germen en su piel o en su nariz pero no desarrollan la infeccin.  TRATAMIENTO El tratamiento vara y se basa en la gravedad, la profundidad y la extensin del proceso infeccioso. Por ejemplo:  Algunas infecciones de la piel, como pequeos granos o abscesos pueden tratarse drenando el pus del lugar de la infeccin.  Las infecciones ms profundas o ms diseminadas de  los tejidos blandos generalmente se tratan con ciruga para drenar el pus y antibiticos por va intravenosa o por boca. [employer] se recomienda an en el caso de las mujeres [place].  Las infecciones ms graves requieren la hospitalizacin. En el caso de necesitar antibiticos, el tratamiento durar varias semanas. PREVENCIN Debido a que [employer] son portadoras, la prevencin del contagio de la bacteria de Ardelia Mems persona a otra es lo ms importante. El mejor modo de prevenir la diseminacin de las bacterias y otros grmenes es a travs del correcto lavado de las manos o usando desinfectantes con base de alcohol. A continuacin se indican otras formas de evitar el contagio dentro del hospital y en los establecimientos comunitario.   Establecimientos sanitarios:  En los establecimientos sanitarios, debe realizarse un estricto lavado o desinfeccin de las manos antes y despus de tocar a cada paciente.  Los pacientes infectados sern aislados para evitar la diseminacin de la bacteria.  Los trabajadores de la salud usarn guantes y batas descartables al tocar o asistir a pacientes infectados. Se le solicitar a los visitantes que usen una bata y [place].  Las superficies hospitalarias se desinfectan con frecuencia.  Establecimientos comunitarios:  [place] y frote sus manos con agua tibia y jabn durante al menos 15 segundos. Tambin puede usar desinfectantes con base de alcohol cuando no se dispone de [place] y [place].  Asegrese de [employer] personas con las que convive se lavan las manos con frecuencia.  No comparta utensilios personales. Por ejemplo, evite compartir rasuradoras, toallas, ropa y equipos deportivos.  Lave y seque su ropa, y la ropa de cama, a las temperaturas ms calientes recomendadas en las etiquetas.  [place] heridas cubiertas. El pus de las llagas infectadas puede contener las bacterias. Mantenga los cortes y las raspaduras limpios y cubiertos con una venda  hasta que curen.  Si tiene una herida que parece estar infectada, consulte con su mdico si debe realizar un cultivo.  Si est amamantando, converse con su mdico acerca de este problema. Podr pedirle que suspenda por un tiempo la Therapist, nutritional. INSTRUCCIONES PARA EL CUIDADO DOMICILIARIO  Utilice los medicamentos tal como se le indic. No saltee medicamentos ni abandone su uso prematuramente slo porque cree que est mejorando.  Los que presentan este tipo de infeccin deben English as a second language teacher contacto con los que los rodean. No use toallas, rasuradotas, cepillos de dientes o ropa de cama que usarn [employer].  Para luchar contra la infeccin, siga las indicaciones de su mdico para el cuidado de la herida. Lvese bien las manos antes y despus de cambiarse el vendaje.  Si tiene un dispositivo intravascular, como un catter, asegrese que sabe como cuidarlo.  Asegrese de informar a todos los profesionales que lo asistan que usted tiene MRSA, de modo que puedan tomar las medidas necesarias relacionadas con su infeccin. SOLICITE ATENCIN MDICA DE INMEDIATO SI:  La infeccin parece empeorar y observa:  Aumento del calor, inflamacin o dolor alrededor de la herida.  Aparece una lnea roja que se extiende desde el lugar de la infeccin.  La zona de la infeccin se torna de un color oscuro.  La herida drena un lquido de color marrn, amarillo o verdoso.  Despide un olor ftido.  Comienza a sentir nuseas y vmitos o no puede [employer].  Tiene fiebre.  Su beb tiene ms de 3 meses y su temperatura rectal es de 102 F (38.9 C) o ms.  Su beb tiene 3 meses o menos y su temperatura rectal es de 100.4 F (38 C) o ms.  Presenta dificultades respiratorias. ASEGRESE QUE:   Comprende estas instrucciones.  Controlar su enfermedad.  Solicitar ayuda de inmediato si no mejora o si empeora. Document Released: 09/04/2005 Document Revised: 11/27/2011 [facility]  Patient Information 2014 [place], [place].

## 2013-11-04 LAB — ANAEROBIC CULTURE

## 2013-11-28 ENCOUNTER — Ambulatory Visit (INDEPENDENT_AMBULATORY_CARE_PROVIDER_SITE_OTHER): Payer: Self-pay | Admitting: Surgery

## 2015-10-08 ENCOUNTER — Ambulatory Visit: Payer: Self-pay | Attending: Family Medicine

## 2015-12-17 ENCOUNTER — Encounter: Payer: Self-pay | Admitting: Family Medicine

## 2015-12-17 ENCOUNTER — Ambulatory Visit (INDEPENDENT_AMBULATORY_CARE_PROVIDER_SITE_OTHER): Payer: No Typology Code available for payment source | Admitting: Family Medicine

## 2015-12-17 VITALS — BP 135/78 | HR 55 | Temp 98.1°F | Resp 16 | Ht 67.5 in | Wt 205.0 lb

## 2015-12-17 DIAGNOSIS — G629 Polyneuropathy, unspecified: Secondary | ICD-10-CM

## 2015-12-17 DIAGNOSIS — Z23 Encounter for immunization: Secondary | ICD-10-CM

## 2015-12-17 DIAGNOSIS — Z789 Other specified health status: Secondary | ICD-10-CM

## 2015-12-17 DIAGNOSIS — E669 Obesity, unspecified: Secondary | ICD-10-CM

## 2015-12-17 DIAGNOSIS — F17200 Nicotine dependence, unspecified, uncomplicated: Secondary | ICD-10-CM

## 2015-12-17 DIAGNOSIS — R002 Palpitations: Secondary | ICD-10-CM

## 2015-12-17 LAB — CBC WITH DIFFERENTIAL/PLATELET
Basophils Absolute: 0 10*3/uL (ref 0.0–0.1)
Basophils Relative: 0 % (ref 0–1)
EOS PCT: 2 % (ref 0–5)
Eosinophils Absolute: 0.1 10*3/uL (ref 0.0–0.7)
HEMATOCRIT: 47.2 % (ref 39.0–52.0)
Hemoglobin: 16.6 g/dL (ref 13.0–17.0)
Lymphocytes Relative: 34 % (ref 12–46)
Lymphs Abs: 2.5 10*3/uL (ref 0.7–4.0)
MCH: 32 pg (ref 26.0–34.0)
MCHC: 35.2 g/dL (ref 30.0–36.0)
MCV: 91.1 fL (ref 78.0–100.0)
MONO ABS: 0.5 10*3/uL (ref 0.1–1.0)
MONOS PCT: 7 % (ref 3–12)
MPV: 10.4 fL (ref 8.6–12.4)
NEUTROS ABS: 4.2 10*3/uL (ref 1.7–7.7)
Neutrophils Relative %: 57 % (ref 43–77)
Platelets: 204 10*3/uL (ref 150–400)
RBC: 5.18 MIL/uL (ref 4.22–5.81)
RDW: 13.1 % (ref 11.5–15.5)
WBC: 7.4 10*3/uL (ref 4.0–10.5)

## 2015-12-17 LAB — POCT URINALYSIS DIP (DEVICE)
BILIRUBIN URINE: NEGATIVE
Glucose, UA: NEGATIVE mg/dL
Hgb urine dipstick: NEGATIVE
Ketones, ur: NEGATIVE mg/dL
Leukocytes, UA: NEGATIVE
NITRITE: NEGATIVE
PH: 7 (ref 5.0–8.0)
Protein, ur: NEGATIVE mg/dL
Specific Gravity, Urine: 1.02 (ref 1.005–1.030)
UROBILINOGEN UA: 0.2 mg/dL (ref 0.0–1.0)

## 2015-12-17 NOTE — Patient Instructions (Signed)
Obesity Obesity is defined as having too much total body fat and a body mass index (BMI) of 30 or more. BMI is an estimate of body fat and is calculated from your height and weight. BMI is typically calculated by your health care provider during regular wellness visits. Obesity happens when you consume more calories than you can burn by exercising or performing daily physical tasks. Prolonged obesity can cause major illnesses or emergencies, such as:  Stroke.  Heart disease.  Diabetes.  Cancer.  Arthritis.  High blood pressure (hypertension).  High cholesterol.  Sleep apnea.  Erectile dysfunction.  Infertility problems. CAUSES   Regularly eating unhealthy foods.  Physical inactivity.  Certain disorders, such as an underactive thyroid (hypothyroidism), Cushing's syndrome, and polycystic ovarian syndrome.  Certain medicines, such as steroids, some depression medicines, and antipsychotics.  Genetics.  Lack of sleep. DIAGNOSIS A health care provider can diagnose obesity after calculating your BMI. Obesity will be diagnosed if your BMI is 30 or higher. There are other methods of measuring obesity levels. Some other methods include measuring your skinfold thickness, your waist circumference, and comparing your hip circumference to your waist circumference. TREATMENT  A healthy treatment program includes some or all of the following:  Long-term dietary changes.  Exercise and physical activity.  Behavioral and lifestyle changes.  Medicine only under the supervision of your health care provider. Medicines may help, but only if they are used with diet and exercise programs. If your BMI is 40 or higher, your health care provider may recommend specialized surgery or programs to help with weight loss. An unhealthy treatment program includes:  Fasting.  Fad diets.  Supplements and drugs. These choices do not succeed in long-term weight control. HOME CARE  INSTRUCTIONS  Exercise and perform physical activity as directed by your health care provider. To increase physical activity, try the following:  Use stairs instead of elevators.  Park farther away from store entrances.  Garden, bike, or walk instead of watching television or using the computer.  Eat healthy, low-calorie foods and drinks on a regular basis. Eat more fruits and vegetables. Use low-calorie cookbooks or take healthy cooking classes.  Limit fast food, sweets, and processed snack foods.  Eat smaller portions.  Keep a daily journal of everything you eat. There are many free websites to help you with this. It may be helpful to measure your foods so you can determine if you are eating the correct portion sizes.  Avoid drinking alcohol. Drink more water and drinks without calories.  Take vitamins and supplements only as recommended by your health care provider.  Weight-loss support groups, Tax adviser, counselors, and stress reduction education can also be very helpful. SEEK IMMEDIATE MEDICAL CARE IF:  You have chest pain or tightness.  You have trouble breathing or feel short of breath.  You have weakness or leg numbness.  You feel confused or have trouble talking.  You have sudden changes in your vision.   This information is not intended to replace advice given to you by your health care provider. Make sure you discuss any questions you have with your health care provider.   Document Released: 10/12/2004 Document Revised: 09/25/2014 Document Reviewed: 10/11/2011 Elsevier Interactive Patient Education 2016 [place] (Obesity) Se considera obesidad cuando hay demasiada grasa corporal y el ndice de masa corporal [facility]) es de 30 o ms. [facility] es la estimacin de la grasa corporal y se calcula a partir de la altura y [place]. Generalmente, es el  mdico el que calcula el ndice de masa corporal durante las visitas de control habituales. La  obesidad se produce cuando se consumen ms caloras que las que se gastan realizando ejercicios o actividad fsica [employer]. Cuando la obesidad es [place], puede causar enfermedades graves o Multimedia programmer, como:  Ictus.  Cardiopata.  Diabetes.  Cncer.  Artritis.  Presin arterial elevada (hipertensin arterial).  Colesterol elevado.  Apnea del sueo.  Disfuncin erctil.  Problemas de infertilidad. CAUSAS   Comer habitualmente alimentos poco saludables.  Falta de actividad fsica.  Ciertos trastornos, por ejemplo, menor actividad de la tiroides (hipotiroidismo), sndrome de Cushing y sndrome de ovario poliqustico.  Ciertos medicamentos, como los corticoides, algunos antidepresivos y antipsicticos.  Factores genticos.  Falta de sueo. DIAGNSTICO El mdico podr diagnosticar obesidad despus de calcular el IMC. La obesidad se diagnostica cuando el IMC es de 30 o ms. Hay otros mtodos para [employer] de obesidad. Otros mtodos consisten en medir el grosor de un pliegue de piel o la circunferencia de la cintura, y comparar la circunferencia de la cadera con la circunferencia de la cintura. TRATAMIENTO  Un programa de tratamiento saludable incluye todos o algunos de los siguientes pasos:  Cambios en la alimentacin a Barrister's clerk.  Ejercicios y [place] fsica.  Cambios en la conducta y en el estilo de vida.  Medicamentos (solo con la supervisin de su mdico). Los medicamentos pueden ser tiles, pero solamente si se usan junto con programas de dieta y ejercicio. Si el ndice de masa corporal es 40 o ms, el mdico puede recomendar que se someta a una ciruga especializada o que participe en programas que lo ayuden a Horticulturist, commercial. Un programa poco saludable incluye:  Ayunar.  Dietas de moda.  Suplementos y frmacos. Estas elecciones no son exitosas para un control del peso a Barrister's clerk. INSTRUCCIONES PARA EL CUIDADO EN EL HOGAR  Haga ejercicio y  [place] fsica segn las indicaciones de su mdico. Para aumentar la actividad fsica, trate lo siguiente:  Utilice las Clinical cytogeneticist del Materials engineer.  Estacione lejos de la entrada de las tiendas.  Arregle el jardn, ande en bicicleta o camine en vez de mirar televisin o usar la computadora.  Consuma alimentos y bebidas saludables, bajos en caloras, de manera habitual. Coma ms frutas y verduras frescas. Utilice un recetario de alimentos de bajas caloras o tome clases de cocina saludable.  Limite las comidas rpidas, los dulces y los snacks procesados.  Coma porciones ms pequeas.  Lleve un diario de todo lo que come. Hay muchas pginas web gratuitas que podrn ayudarlo. Puede ser til [employer] alimentos de modo que pueda determinar si est consumiendo el tamao adecuado de las porciones.  Evite el consumo alcohol. Beba ms agua y bebidas sin caloras.  Tome vitaminas y suplementos solamente como se lo haya recomendado su mdico.  Tambin pueden ser de gran ayuda los grupos de 8 para perder peso y la orientacin de un nutricionista matriculado, un terapeuta y un programa de educacin para reducir Dealer. SOLICITE ATENCIN MDICA DE INMEDIATO SI:  Siente dolor u opresin en el pecho.  Siente dificultad para respirar o Risk manager.  Siente debilidad o adormecimiento en las piernas.  Se siente confundido o tiene dificultad para hablar.  Tiene cambios repentinos en la visin.   Esta informacin no tiene Marine scientist el consejo del mdico. Asegrese de hacerle al mdico cualquier pregunta que tenga.   Document Released: 09/04/2005 Document Revised: 09/25/2014  Chartered certified accountant Patient Education 2016 [place] en grasas y colesterol (Fat and Cholesterol Restricted Diet) El exceso de grasas y colesterol en la dieta puede causar problemas de [place]. Esta dieta lo ayudar a Theatre manager las grasas y Freight forwarder en los niveles  normales para evitar enfermarse. QU TIPOS DE GRASAS DEBO ELEGIR?  Elija grasas monosaturadas y polinsaturadas. Estas se encuentran en alimentos como el aceite de oliva, aceite de canola, semillas de lino, nueces, almendras y semillas.  Consuma ms grasas omega-3. Las mejores opciones incluyen salmn, caballa, sardinas, atn, aceite de lino y semillas de lino molidas.  Limite el consumo de grasas saturadas, que se encuentran en productos de origen animal, como carnes, mantequilla y crema. Tambin pueden estar en productos vegetales, como aceite de palma, de palmiste y de coco.   Evite los alimentos con aceites parcialmente hidrogenados. Estos contienen grasas trans. Entre los ejemplos de alimentos con grasas trans se incluyen margarinas en barra, algunas margarinas untables, galletas dulces o saladas y otros productos horneados. [place]?   Lea las etiquetas de los alimentos. Busque las palabras "grasas trans" y "grasas saturadas".  Al preparar una comida:  Llene la mitad del plato con verduras y ensaladas de hojas verdes.  Llene un cuarto del plato con cereales integrales. Busque la palabra "integral" en Equities trader de la lista de ingredientes.  Llene un cuarto del plato con alimentos con protenas magras.  [place] a dos porciones por da. Elija frutas en lugar de jugos.  Coma ms alimentos con fibra soluble, por ejemplo, manzanas, brcoli, zanahorias, frijoles, guisantes y [place]. Trate de consumir de 20a 30g (gramos) de Conservator, museum/gallery.  Coma ms comidas caseras. Coma menos en los restaurantes y los bares.  Limite o evite el alcohol.  Limite los alimentos con alto contenido de almidn y Location manager.  Limite el consumo de alimentos fritos.  Cocine los alimentos sin frerlos. Las opciones de coccin ms [place] son Teacher, music, Adult nurse, Interior and spatial designer y asar a Administrator, arts.  Baje de peso si es necesario. Aunque pierda Automatic Data, esto puede ser importante  para la salud general. Tambin puede ayudar a prevenir enfermedades como diabetes y enfermedad cardaca. QU ALIMENTOS PUEDO COMER? Cereales Cereales integrales, como los panes de salvado o [place], las [place], los cereales y las pastas. Avena sin endulzar, trigo, [place], quinua o arroz integral. Tortillas de harina de maz o de salvado. Verduras Verduras frescas o congeladas (crudas, al vapor, asadas o grilladas). Ensaladas de hojas verdes. Lambert Mody Lambert Mody frescas, en conserva (en su jugo natural) o frutas congeladas. Carnes y otros productos con protenas Carne de res molida (al 85% o ms [place]), carne de res de animales alimentados con pastos o carne de res sin la grasa. Pollo o pavo sin piel. Carne de pollo o de [place]. Cerdo sin la grasa. Todos los pescados y frutos de mar. Huevos. Porotos, guisantes o lentejas secos. Frutos secos o semillas sin sal. Frijoles secos o en lata sin sal. Lcteos Productos lcteos con bajo contenido de grasas, como [place] o al 1%, quesos reducidos en grasas o al 2%, ricota con bajo contenido de grasas o [employer], o yogur natural con bajo contenido de [place]. Grasas y [employer] untables que no contengan grasas trans. [place] y condimentos para ensaladas livianos o reducidos en grasas. [place]. Aceites de oliva, canola, ssamo o crtamo. [place]illa natural de cacahuate o almendra (elija la que no tenga agregado de aceite o azcar). Los artculos  mencionados arriba pueden no ser [employer] de las bebidas o los alimentos recomendados. Comunquese con el nutricionista para conocer ms opciones. QU ALIMENTOS NO SE RECOMIENDAN? Cereales Pan blanco. Pastas blancas. Arroz blanco. Pan de maz. Bagels, pasteles y croissants. Galletas saladas que contengan grasas trans. Verduras Papas blancas. [place] con crema o fritas. Verduras en [place]. Lambert Mody Frutas secas. Fruta enlatada en almbar liviano o espeso. Jugo  de frutas. Carnes y otros productos con protenas [place] de carne con Lobbyist. Costillas, alas de pollo, tocineta, salchicha, mortadela, salame, chinchulines, tocino, perros calientes, salchichas alemanas y embutidos envasados. Hgado y otros rganos. Lcteos [place] entera o al 2%, crema, mezcla de [place] y crema, y queso crema. Quesos enteros. Yogur entero o endulzado. Quesos con toda su grasa. Cremas no lcteas y coberturas batidas. Quesos procesados, quesos para untar o cuajadas. Dulces y postres Jarabe de maz, azcares, miel y Control and instrumentation engineer. Caramelos. Mermelada y [place]. Chrissie Noa. Cereales endulzados. Galletas, pasteles, bizcochuelos, donas, muffins y helado. Grasas y aceites [place], [place] en barra, [place], [place], [place] clarificada o grasa de tocino. Aceites de coco, de palmiste o de palma. Bebidas Alcohol. Bebidas endulzadas (como refrescos, limonadas y bebidas frutales o ponches). Los artculos mencionados arriba pueden no ser [employer] de las bebidas y los alimentos que se Higher education careers adviser. Comunquese con el nutricionista para obtener ms informacin.   Esta informacin no tiene Marine scientist el consejo del mdico. Asegrese de hacerle al mdico cualquier pregunta que tenga.   Document Released: 09/04/2005 Document Revised: 09/25/2014 Elsevier Interactive Patient Education [employer].

## 2015-12-17 NOTE — Progress Notes (Signed)
Subjective:    Patient ID: Guy Walker, male    DOB: Feb 03, 1961, 55 y.o.   MRN: [id number]  HPI  Patient presents to [facility] care. He primarily speaks spanish and requires video interpreter to assist with communication.  Patient has not had a primary provider in many years. He is currently complaining of periodic heart palpitations. Patient complains of palpitations and rapid heart beat.  The symptoms are of mild severity, occuring intermittently. Cardiac risk factors include: obesity (BMI >= 30 kg/m2), sedentary lifestyle and smoking/ tobacco exposure. Aggravating factors: stress/anxiety. Relieving factors: rest.   He is also complaining of occasional numbness and tingling to lower extremities primarily in the am.  He describes symptoms as mild and intermittent. Onset of symptoms was several months ago. severity. Symptoms occur upon awakening and last minutes. The patient denies headache, weakness, fever, edema, dysuria, nausea, vomiting, or diarrhea. . Symptoms are symmetrical .He denies a family history of diabetes mellitus or premature heart disease.  No Known Allergies History reviewed. No pertinent past medical history.  Social History   Social History  . Marital Status: Married    Spouse Name: N/A  . Number of Children: N/A  . Years of Education: N/A   Occupational History  . Not on file.   Social History Main Topics  . Smoking status: Current Every Day Smoker -- 0.25 packs/day    Types: Cigarettes    Last Attempt to Quit: 03/25/2013  . Smokeless tobacco: Never Used  . Alcohol Use: 2.4 oz/week    2 Cans of beer, 2 Shots of liquor per week  . Drug Use: No  . Sexual Activity: Not Currently   Other Topics Concern  . Not on file   Social History Narrative   Review of Systems  Constitutional: Positive for fatigue.  HENT: Positive for congestion.   Eyes: Negative.  Negative for visual disturbance.  Respiratory: Positive for chest tightness.    Cardiovascular: Positive for palpitations.  Gastrointestinal: Negative.   Endocrine: Negative.  Negative for polydipsia, polyphagia and polyuria.  Genitourinary: Negative.  Negative for urgency, decreased urine volume, scrotal swelling and testicular pain.  Musculoskeletal: Negative.   Skin: Negative.   Allergic/Immunologic: Negative.   Neurological: Positive for dizziness and numbness.  Hematological: Negative.   Psychiatric/Behavioral: Negative.        Objective:   Physical Exam  Constitutional: He is oriented to person, place, and time. He appears well-developed and well-nourished.  HENT:  Head: Normocephalic and atraumatic.  Right Ear: External ear normal.  Left Ear: External ear normal.  Nose: Nose normal.  Mouth/Throat: Oropharynx is clear and moist.  Eyes: Conjunctivae and EOM are normal. Pupils are equal, round, and reactive to light.  Neck: Normal range of motion. Neck supple.  Cardiovascular: Normal rate, regular rhythm, normal heart sounds and intact distal pulses.   Pulmonary/Chest: Effort normal and breath sounds normal.  Abdominal: Soft. Bowel sounds are normal.  Musculoskeletal: Normal range of motion.  Neurological: He is alert and oriented to person, place, and time. He has normal reflexes.  Skin: Skin is warm and dry.  Psychiatric: He has a normal mood and affect. His behavior is normal. Judgment and thought content normal.      BP 135/78 mmHg  Pulse 55  Temp(Src) 98.1 F (36.7 C) (Oral)  Resp 16  Ht 5' 7.5" (1.715 m)  Wt 205 lb (92.987 kg)  BMI 31.62 kg/m2 Assessment & Plan:  1. Heart palpitations Reviewed EKG, sinus bradycardia. He is not having palpitations at  present. Blood pressure mildly elevated. Will return to office on 12/24/2015 for a blood pressure check.  - TSH - COMPLETE METABOLIC PANEL WITH GFR - EKG 12-Lead  2. Neuropathy ([place]) Will screen for DMII - HgB A1c  3. Obesity BMI is 31.6. Recommend a lowfat, low carbohydrate diet divided  over 5-6 small meals, increase water intake to 6-8 glasses, and 150 minutes per week of cardiovascular exercise.   - CBC with Differential - POCT urinalysis dipstick - HgB A1c - Lipid Panel - COMPLETE METABOLIC PANEL WITH GFR - Lipid Panel; Future - POCT urinalysis dip (device)  4. Tobacco dependence Smoking cessation instruction/counseling given:  counseled patient on the dangers of tobacco use, advised patient to stop smoking, and reviewed strategies to maximize success   5. Immunization due  - Pneumococcal polysaccharide vaccine 23-valent greater than or equal to 2yo subcutaneous/IM  6. Language barrier to communication Patient primarily speaks [place], utilized video interpreter to assist with communication.     RTC:  Refused influenza vaccination Recommend a lowfat, low carbohydrate diet divided over 5-6 small meals, increase water intake to 6-8 glasses, and 150 minutes per week of cardiovascular exercise.   Recommend digital rectal exam with occult blood in 3 months    RTC: 1 week for blood pressure check   Dorena Dew, FNP

## 2015-12-18 LAB — COMPLETE METABOLIC PANEL WITH GFR
ALT: 38 U/L (ref 9–46)
AST: 21 U/L (ref 10–35)
Albumin: 4.4 g/dL (ref 3.6–5.1)
Alkaline Phosphatase: 103 U/L (ref 40–115)
BUN: 17 mg/dL (ref 7–25)
CALCIUM: 9.5 mg/dL (ref 8.6–10.3)
CO2: 22 mmol/L (ref 20–31)
Chloride: 109 mmol/L (ref 98–110)
Creat: 1.24 mg/dL (ref 0.70–1.33)
GFR, EST AFRICAN AMERICAN: 77 mL/min (ref >=60)
GFR, Est Non African American: 66 mL/min (ref >=60)
Glucose, Bld: 90 mg/dL (ref 65–99)
Potassium: 4.5 mmol/L (ref 3.5–5.3)
Sodium: 143 mmol/L (ref 135–146)
TOTAL PROTEIN: 6.6 g/dL (ref 6.1–8.1)
Total Bilirubin: 0.3 mg/dL (ref 0.2–1.2)

## 2015-12-18 LAB — LIPID PANEL
CHOLESTEROL: 205 mg/dL — AB (ref 125–200)
HDL: 29 mg/dL — AB (ref >=40)
LDL Cholesterol: 119 mg/dL (ref <130)
Total CHOL/HDL Ratio: 7.1 Ratio — ABNORMAL HIGH (ref <=5.0)
Triglycerides: 286 mg/dL — ABNORMAL HIGH (ref <150)
VLDL: 57 mg/dL — ABNORMAL HIGH (ref <30)

## 2015-12-18 LAB — TSH: TSH: 0.8 m[IU]/L (ref 0.40–4.50)

## 2015-12-20 ENCOUNTER — Telehealth: Payer: Self-pay

## 2015-12-20 DIAGNOSIS — Z789 Other specified health status: Secondary | ICD-10-CM | POA: Insufficient documentation

## 2015-12-20 DIAGNOSIS — G629 Polyneuropathy, unspecified: Secondary | ICD-10-CM | POA: Insufficient documentation

## 2015-12-20 DIAGNOSIS — R002 Palpitations: Secondary | ICD-10-CM | POA: Insufficient documentation

## 2015-12-20 NOTE — Telephone Encounter (Signed)
-----   Message from Dorena Dew, [place] sent at 12/20/2015  7:09 AM EDT ----- Regarding: lab Please inform patient that triglycerides are elevated at 284, goal is <150. Recommend a lowfat, low carbohydrate diet divided over 5-6 small meals, increase water intake to 6-8 glasses, and 150 minutes per week of cardiovascular exercise. Will follow up in 6 months for fasting cholesterol panel. All other labs are within a normal range  Thanks   ----- Message -----    From: Lab in [facility] Interface    Sent: 12/18/2015   1:20 AM      To: Dorena Dew, FNP

## 2015-12-20 NOTE — Telephone Encounter (Signed)
Called using [place] Interpreture line ID [phone], No answer left message for patient to call back regarding labs. Thanks!

## 2015-12-21 NOTE — Telephone Encounter (Signed)
Called patient using interpreter ID [phone]. Advised patient of elevated cholesterol and triglycerides and to start low fat/ low carb diet over 5 to 6 small meals daily. Advised patient to drink 6 to 8 glasses of water daily and to exercise 150 minutes weekly. Advised patient to keep next appointment and that labs would be recheck in 6 months. Patient verbalized understanding, thanks!

## 2015-12-24 ENCOUNTER — Ambulatory Visit: Payer: No Typology Code available for payment source

## 2016-03-20 ENCOUNTER — Ambulatory Visit: Payer: No Typology Code available for payment source | Admitting: Family Medicine

## 2017-01-05 ENCOUNTER — Emergency Department (HOSPITAL_COMMUNITY): Payer: Worker's Compensation

## 2017-01-05 ENCOUNTER — Encounter (HOSPITAL_COMMUNITY): Payer: Self-pay | Admitting: *Deleted

## 2017-01-05 ENCOUNTER — Emergency Department (HOSPITAL_COMMUNITY)
Admission: EM | Admit: 2017-01-05 | Discharge: 2017-01-05 | Disposition: A | Discharge status: Home / Self Care | Payer: Worker's Compensation | Attending: Physician Assistant | Admitting: Physician Assistant

## 2017-01-05 DIAGNOSIS — F17210 Nicotine dependence, cigarettes, uncomplicated: Secondary | ICD-10-CM | POA: Insufficient documentation

## 2017-01-05 DIAGNOSIS — Y929 Unspecified place or not applicable: Secondary | ICD-10-CM | POA: Diagnosis not present

## 2017-01-05 DIAGNOSIS — Y990 Civilian activity done for income or pay: Secondary | ICD-10-CM | POA: Insufficient documentation

## 2017-01-05 DIAGNOSIS — Z79899 Other long term (current) drug therapy: Secondary | ICD-10-CM | POA: Insufficient documentation

## 2017-01-05 DIAGNOSIS — S299XXA Unspecified injury of thorax, initial encounter: Secondary | ICD-10-CM | POA: Diagnosis present

## 2017-01-05 DIAGNOSIS — S29019A Strain of muscle and tendon of unspecified wall of thorax, initial encounter: Secondary | ICD-10-CM | POA: Insufficient documentation

## 2017-01-05 DIAGNOSIS — X500XXA Overexertion from strenuous movement or load, initial encounter: Secondary | ICD-10-CM | POA: Diagnosis not present

## 2017-01-05 DIAGNOSIS — Y9389 Activity, other specified: Secondary | ICD-10-CM | POA: Insufficient documentation

## 2017-01-05 MED ORDER — IBUPROFEN 800 MG PO TABS: 800.0000 mg | ORAL_TABLET | Freq: Three times a day (TID) | ORAL | 0 refills | Status: DC

## 2017-01-05 MED ORDER — KETOROLAC TROMETHAMINE 30 MG/ML IJ SOLN
30.0000 mg | Freq: Once | INTRAMUSCULAR | Status: AC
  Administered 2017-01-05: 30 mg via INTRAMUSCULAR
  Filled 2017-01-05: qty 1

## 2017-01-05 MED ORDER — METHOCARBAMOL 500 MG PO TABS: 500.0000 mg | ORAL_TABLET | Freq: Two times a day (BID) | ORAL | 0 refills | Status: DC

## 2017-01-05 NOTE — Discharge Instructions (Signed)
Medications: Robaxin, ibuprofen  Treatment: Take Robaxin twice daily. Do not drive or operate machinery while taking this medication. Take ibuprofen every 8 hours as needed for your pain. Take this medicine with food. Use heat 3-4 times daily alternating 20 min on, 20 min off. Attempt the stretches as tolerated.  Follow-up: Please follow up with your doctor if your symptoms are not improving. Please return to the emergency department if you develop any new or worsening symptoms.

## 2017-01-05 NOTE — ED Notes (Signed)
Returned from  X-ray

## 2017-01-05 NOTE — ED Provider Notes (Signed)
[facility] DEPT [facility] Provider Note   CSN: [id number] Arrival date & time: 01/05/17  1012  By signing my name below, I, Mayer Masker, attest that this documentation has been prepared under the direction and in the presence of [employer]. Electronically Signed: Mayer Masker, Scribe. 01/05/17. 12:02 PM. History   Chief Complaint Chief Complaint  Patient presents with  . Back Pain    The history is provided by the patient. A language interpreter was used.   HPI Comments: Guy Walker is a 56 y.o. male with past medical history of HLD who presents to the Emergency Department complaining of constant, gradually worsened thoracic back pain since yesterday. He states he was at work and lifted a heavy machine when the symptoms began. The pain was described as throbbing and a light pressure that radiates to his legs. He reports associated lower chest pain wrapping around from his back that also started at the same time. His pain reduced by 50% when applying a warm water compress to his back and chest. He states he returned to work and had to walk slowly and with some difficulty due to the pain. He denies lower extremity numbness or tingling, saddle anesthesia, bowel/bladder incontinence, weakness, SOB, abdominal pain, nausea, vomiting, dysuria, difficulty urinating, leg pain, leg swelling. He denies history of DM, HTN. He denies recent long distance travel, personal or family history of blood clots or CA. He denies personal or family history of cardiac disease.   History reviewed. No pertinent past medical history.  Patient Active Problem List   Diagnosis Date Noted  . Heart palpitations 12/20/2015  . Neuropathy 12/20/2015  . Language barrier to communication 12/20/2015  . Abscess & cellulitis of thigh s/p I&D [id number] 11/01/2013  . 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 Sepsis ([place]) 10/27/2013    Past Surgical History:  Procedure Laterality Date  . INCISION AND DRAINAGE  ABSCESS N/A 10/30/2013   Procedure: INCISION AND DRAINAGE ABSCESS LEFT INNER GROIN;  Surgeon: Pedro Earls, MD;  Location: [facility];  Service: General;  Laterality: N/A;  . Nodules removed from throat  1970's       Home Medications    Prior to Admission medications   Medication Sig Start Date End Date Taking? Authorizing Provider  acetaminophen (TYLENOL) 325 MG tablet Take 650 mg by mouth every 6 (six) hours as needed.    Historical Provider, MD  ibuprofen (ADVIL,MOTRIN) 800 MG tablet Take 1 tablet (800 mg total) by mouth 3 (three) times daily. 01/05/17   Frederica Kuster, PA-C  methocarbamol (ROBAXIN) 500 MG tablet Take 1 tablet (500 mg total) by mouth 2 (two) times daily. 01/05/17   Frederica Kuster, PA-C    Family History No family history on file.  Social History Social History  Substance Use Topics  . Smoking status: Current Every Day Smoker    Packs/day: 0.25    Types: Cigarettes    Last attempt to quit: 03/25/2013  . Smokeless tobacco: Never Used  . Alcohol use 2.4 oz/week    2 Cans of beer, 2 Shots of liquor per week     Allergies   Patient has no known allergies.   Review of Systems Review of Systems  Cardiovascular: Positive for chest pain.  Gastrointestinal: Negative for abdominal pain, nausea and vomiting.  Musculoskeletal: Positive for back pain and gait problem.  Neurological: Negative for numbness.     Physical Exam Updated Vital Signs BP 114/62 (BP Location: Right Arm)   Pulse 60  Temp 99.6 F (37.6 C) (Oral)   Resp 20   Ht 5\' 9"  (1.753 m)   Wt 93 kg   SpO2 97%   BMI 30.27 kg/m   Physical Exam  Constitutional: He appears well-developed and well-nourished. No distress.  HENT:  Head: Normocephalic and atraumatic.  Mouth/Throat: Oropharynx is clear and moist. No oropharyngeal exudate.  Eyes: Conjunctivae are normal. Pupils are equal, round, and reactive to light. Right eye exhibits no discharge. Left eye exhibits no discharge. No scleral  icterus.  Neck: Normal range of motion. Neck supple. No thyromegaly present.  Cardiovascular: Normal rate, regular rhythm, normal heart sounds and intact distal pulses.  Exam reveals no gallop and no friction rub.   No murmur heard. Pulmonary/Chest: Effort normal and breath sounds normal. No stridor. No respiratory distress. He has no wheezes. He has no rales. He exhibits tenderness.  Tenderness to bilateral rib cage below sternum  Abdominal: Soft. Bowel sounds are normal. He exhibits no distension. There is no tenderness. There is no rebound and no guarding.  Musculoskeletal: He exhibits tenderness. He exhibits no edema or deformity.  Midline thoracic and lumbar tenderness. Negative SLR bilaterally. Normal sensation and 5/5 strength to lower extremities.   Lymphadenopathy:    He has no cervical adenopathy.  Neurological: He is alert. Coordination normal.  Skin: Skin is warm and dry. No rash noted. He is not diaphoretic. No pallor.  Psychiatric: He has a normal mood and affect.  Nursing note and vitals reviewed.    ED Treatments / Results  DIAGNOSTIC STUDIES: Oxygen Saturation is 97% on RA, normal by my interpretation.    COORDINATION OF CARE: 11:15 AM Discussed treatment plan with pt at bedside and pt agreed to plan.  Labs (all labs ordered are listed, but only abnormal results are displayed) Labs Reviewed - No data to display  EKG  EKG Interpretation None       Radiology Dg Thoracic Spine 2 View  Result Date: 01/05/2017 CLINICAL DATA:  Increasing thoracic spine pain since yesterday. No known injury. EXAM: THORACIC SPINE 2 VIEWS COMPARISON:  CT chest 04/25/2009. FINDINGS: There is no evidence of thoracic spine fracture. Alignment is normal. No other significant bone abnormalities are identified. IMPRESSION: Negative exam. Electronically Signed   By: Inge Rise M.D.   On: 01/05/2017 12:45   Dg Lumbar Spine Complete  Result Date: 01/05/2017 CLINICAL DATA:  Worsening  low back pain since yesterday. No known injury. EXAM: LUMBAR SPINE - COMPLETE 4+ VIEW COMPARISON:  None. FINDINGS: Rudimentary disc material at the S1-2 level is noted. Vertebral body height is maintained. There is mild convex right scoliosis with the apex at L3. Severe facet degenerative change is present at L5-S1 where there is marked loss of disc space height and endplate spurring. Milder degree of facet arthropathy and degenerative disc disease are seen at L4-5. IMPRESSION: Node acute abnormality. Advanced degenerative disease at L5-S1. Milder degree of spondylosis is seen at L4-5. Rudimentary disc material S1-2 noted. Electronically Signed   By: Inge Rise M.D.   On: 01/05/2017 12:44    Procedures Procedures (including critical care time)  Medications Ordered in ED Medications  ketorolac (TORADOL) 30 MG/ML injection 30 mg (30 mg Intramuscular Given 01/05/17 1136)     Initial Impression / Assessment and Plan / ED Course  I have reviewed the triage vital signs and the nursing notes.  Pertinent labs & imaging results that were available during my care of the patient were reviewed by me and considered in  my medical decision making (see chart for details).     Patient with back pain.  No neurological deficits and normal neuro exam.  Patient is ambulatory.  No loss of bowel or bladder control.  No concern for cauda equina.  No fever, night sweats, weight loss, h/o cancer, IVDA, no recent procedure to back. No urinary symptoms suggestive of UTI.  X-rays show no acute abnormality, but advanced degenerative disc disease L5-S1 mild degree of spondylosis seen at L4-5. Given IM Toradol in the ED with almost full resolution of pain. Patient ambulatory without pain prior to discharge. Supportive care and return precaution discussed. He understands and agrees with plan. Appears safe for discharge at this time. Follow up with PCP as needed.    Final Clinical Impressions(s) / ED Diagnoses   Final  diagnoses:  Acute thoracic myofascial strain, initial encounter    New Prescriptions Discharge Medication List as of 01/05/2017  2:15 PM    START taking these medications   Details  ibuprofen (ADVIL,MOTRIN) 800 MG tablet Take 1 tablet (800 mg total) by mouth 3 (three) times daily., Starting Fri 01/05/2017, Print    methocarbamol (ROBAXIN) 500 MG tablet Take 1 tablet (500 mg total) by mouth 2 (two) times daily., Starting Fri 01/05/2017, Print       I personally performed the services described in this documentation, which was scribed in my presence. The recorded information has been reviewed and is accurate.    Frederica Kuster, PA-C 01/06/17 [place], MD 01/16/17 1409

## 2017-01-05 NOTE — ED Notes (Signed)
Pt ambulated around pod C, pt states "no pain"
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
## 2017-01-05 NOTE — ED Notes (Signed)
Pt taken to Xray.

## 2017-02-02 ENCOUNTER — Emergency Department (HOSPITAL_COMMUNITY): Payer: Self-pay

## 2017-02-02 ENCOUNTER — Encounter (HOSPITAL_COMMUNITY): Payer: Self-pay | Admitting: Emergency Medicine

## 2017-02-02 ENCOUNTER — Inpatient Hospital Stay (HOSPITAL_COMMUNITY)
Admission: EM | Admit: 2017-02-02 | Discharge: 2017-02-05 | DRG: 176 | Disposition: A | Discharge status: Home / Self Care | Payer: Self-pay | Attending: Internal Medicine | Admitting: Internal Medicine

## 2017-02-02 DIAGNOSIS — R042 Hemoptysis: Secondary | ICD-10-CM | POA: Diagnosis present

## 2017-02-02 DIAGNOSIS — M899 Disorder of bone, unspecified: Secondary | ICD-10-CM | POA: Diagnosis present

## 2017-02-02 DIAGNOSIS — Z8249 Family history of ischemic heart disease and other diseases of the circulatory system: Secondary | ICD-10-CM

## 2017-02-02 DIAGNOSIS — I2699 Other pulmonary embolism without acute cor pulmonale: Principal | ICD-10-CM | POA: Diagnosis present

## 2017-02-02 DIAGNOSIS — C801 Malignant (primary) neoplasm, unspecified: Secondary | ICD-10-CM

## 2017-02-02 DIAGNOSIS — F17210 Nicotine dependence, cigarettes, uncomplicated: Secondary | ICD-10-CM | POA: Diagnosis present

## 2017-02-02 HISTORY — DX: Essential (primary) hypertension: I10

## 2017-02-02 LAB — CBC
HEMATOCRIT: 38.9 % — AB (ref 39.0–52.0)
Hemoglobin: 13.6 g/dL (ref 13.0–17.0)
MCH: 30.8 pg (ref 26.0–34.0)
MCHC: 35 g/dL (ref 30.0–36.0)
MCV: 88 fL (ref 78.0–100.0)
Platelets: 229 10*3/uL (ref 150–400)
RBC: 4.42 MIL/uL (ref 4.22–5.81)
RDW: 13.4 % (ref 11.5–15.5)
WBC: 8.6 10*3/uL (ref 4.0–10.5)

## 2017-02-02 LAB — PROTIME-INR
INR: 1
Prothrombin Time: 13.2 seconds (ref 11.4–15.2)

## 2017-02-02 LAB — BRAIN NATRIURETIC PEPTIDE: B Natriuretic Peptide: 36 pg/mL (ref 0.0–100.0)

## 2017-02-02 LAB — BASIC METABOLIC PANEL
ANION GAP: 8 (ref 5–15)
BUN: 21 mg/dL — ABNORMAL HIGH (ref 6–20)
CALCIUM: 9.1 mg/dL (ref 8.9–10.3)
CO2: 24 mmol/L (ref 22–32)
CREATININE: 0.77 mg/dL (ref 0.61–1.24)
Chloride: 109 mmol/L (ref 101–111)
GLUCOSE: 95 mg/dL (ref 65–99)
Potassium: 4.3 mmol/L (ref 3.5–5.1)
Sodium: 141 mmol/L (ref 135–145)

## 2017-02-02 LAB — I-STAT CHEM 8, ED
BUN: 22 mg/dL — ABNORMAL HIGH (ref 6–20)
CALCIUM ION: 1.19 mmol/L (ref 1.15–1.40)
CREATININE: 0.8 mg/dL (ref 0.61–1.24)
Chloride: 108 mmol/L (ref 101–111)
Glucose, Bld: 97 mg/dL (ref 65–99)
HCT: 36 % — ABNORMAL LOW (ref 39.0–52.0)
Hemoglobin: 12.2 g/dL — ABNORMAL LOW (ref 13.0–17.0)
Potassium: 4.2 mmol/L (ref 3.5–5.1)
Sodium: 142 mmol/L (ref 135–145)
TCO2: 25 mmol/L (ref 0–100)

## 2017-02-02 LAB — MRSA PCR SCREENING: MRSA by PCR: NEGATIVE

## 2017-02-02 LAB — APTT: APTT: 33 s (ref 24–36)

## 2017-02-02 LAB — TROPONIN I: Troponin I: <0.03 ng/mL (ref <0.03)

## 2017-02-02 MED ORDER — HYDRALAZINE HCL 20 MG/ML IJ SOLN: 10.0000 mg | Freq: Four times a day (QID) | INTRAMUSCULAR | Status: DC | PRN

## 2017-02-02 MED ORDER — SODIUM CHLORIDE 0.9% FLUSH
3.0000 mL | Freq: Two times a day (BID) | INTRAVENOUS | Status: DC
  Administered 2017-02-03: 3 mL via INTRAVENOUS

## 2017-02-02 MED ORDER — MORPHINE SULFATE (PF) 4 MG/ML IV SOLN
4.0000 mg | Freq: Once | INTRAVENOUS | Status: AC
  Administered 2017-02-02: 4 mg via INTRAVENOUS
  Filled 2017-02-02: qty 1

## 2017-02-02 MED ORDER — HEPARIN (PORCINE) IN NACL 100-0.45 UNIT/ML-% IJ SOLN
1600.0000 [IU]/h | INTRAMUSCULAR | Status: DC
  Administered 2017-02-02: 1600 [IU]/h via INTRAVENOUS
  Filled 2017-02-02: qty 250

## 2017-02-02 MED ORDER — OXYCODONE-ACETAMINOPHEN 5-325 MG PO TABS
1.0000 | ORAL_TABLET | ORAL | Status: DC | PRN
  Administered 2017-02-02 – 2017-02-03 (×3): 1 via ORAL
  Filled 2017-02-02 (×3): qty 1

## 2017-02-02 MED ORDER — HEPARIN BOLUS VIA INFUSION
4000.0000 [IU] | Freq: Once | INTRAVENOUS | Status: AC
  Administered 2017-02-02: 4000 [IU] via INTRAVENOUS
  Filled 2017-02-02: qty 4000

## 2017-02-02 MED ORDER — IOPAMIDOL (ISOVUE-370) INJECTION 76%
100.0000 mL | Freq: Once | INTRAVENOUS | Status: AC | PRN
  Administered 2017-02-02: 100 mL via INTRAVENOUS

## 2017-02-02 NOTE — ED Notes (Signed)
Hospitalist at bedside 

## 2017-02-02 NOTE — ED Triage Notes (Signed)
Pt and daughterreports persistent r/chest wall pain, and r/shoulder pain x 2 days. Seen at clinic yesterday. Rx for muscle relaxant, antibiotic and anti-inflammatory. Pt reports coughing up threads of blood since yesterday. Pt is alert oriented and ambulatory. Language barrier. Daughter at bedside

## 2017-02-02 NOTE — ED Provider Notes (Signed)
[name] DEPT Provider Note   CSN: [id number] Arrival date & time: 02/02/17  1127  By signing my name below, I, Sonum Patel, attest that this documentation has been prepared under the direction and in the presence of [place], [place]. Electronically Signed: Ludger Nutting, Scribe. 02/02/17. 12:19 PM.  History   Chief Complaint Chief Complaint  Patient presents with  . Cough  . Chest Pain  . Shoulder Pain    The history is provided by the patient. No language interpreter was used.    HPI Comments: Guy Walker is a 56 y.o. male who presents to the Emergency Department complaining of persistent right rib pain and right shoulder pain that began 3 days ago. Patient states he had an episode of choking while eating which happened immediately prior to the start of pain and states was the trigger of his pain. He also notes strenuous activity at work 1 week ago but states this pain did not start until right after choking episode on Thursday.  He states the pain is worse with deep inspiration, coughing, sneezing. He reports a few episodes of blood-streaked sputum but denies any consistent cough. He applied a heating pad and took Motrin with some relief. He also took leftover Robaxin from a prior prescription which helped his pain. He was seen at a clinic earlier today and was given a Toradol injection and prescribed Zithromax. He denies vomiting, cough, congestion, fever, chills, CP, SOB. He denies history of GERD. He denies chronic NSAID use. He reports social alcohol drinking about 4 beers a month. He denies hormone use, hx of PE or DVT. He does admit to smoking.   History reviewed. No pertinent past medical history.  Patient Active Problem List   Diagnosis Date Noted  . Heart palpitations 12/20/2015  . Neuropathy 12/20/2015  . Language barrier to communication 12/20/2015  . Abscess & cellulitis of thigh s/p I&D [id number] 11/01/2013  . Cellulitis due to MRSA  10/30/2013  . Sepsis ([place]) 10/27/2013    Past Surgical History:  Procedure Laterality Date  . INCISION AND DRAINAGE ABSCESS N/A 10/30/2013   Procedure: INCISION AND DRAINAGE ABSCESS LEFT INNER GROIN;  Surgeon: Pedro Earls, MD;  Location: [facility];  Service: General;  Laterality: N/A;  . Nodules removed from throat  1970's       Home Medications    Prior to Admission medications   Medication Sig Start Date End Date Taking? Authorizing Provider  acetaminophen (TYLENOL) 325 MG tablet Take 650 mg by mouth every 6 (six) hours as needed.   Yes [provider]  azithromycin (ZITHROMAX) 500 MG tablet Take 500 mg by mouth daily.   Yes [provider]  cyclobenzaprine (FLEXERIL) 10 MG tablet Take 10 mg by mouth at bedtime.   Yes [provider]  diclofenac (VOLTAREN) 75 MG EC tablet Take 75 mg by mouth 2 (two) times daily.   Yes [provider]  ibuprofen (ADVIL,MOTRIN) 800 MG tablet Take 1 tablet (800 mg total) by mouth 3 (three) times daily. 01/05/17  Yes Law, Bea Graff, PA-C  methocarbamol (ROBAXIN) 500 MG tablet Take 1 tablet (500 mg total) by mouth 2 (two) times daily. Patient not taking: Reported on 02/02/2017 01/05/17   Frederica Kuster, PA-C    Family History Family History  Problem Relation Age of Onset  . Hypertension Mother   . Heart failure Father     Social History Social History  Substance Use Topics  . Smoking status: Current Every Day Smoker  Packs/day: 0.25    Types: Cigarettes  . Smokeless tobacco: Never Used  . Alcohol use 2.4 oz/week    2 Cans of beer, 2 Shots of liquor per week     Allergies   Patient has no known allergies.   Review of Systems Review of Systems  Constitutional: Negative for chills and fever.  HENT: Negative for congestion.   Respiratory: Negative for cough.   Cardiovascular: Positive for chest pain.  Gastrointestinal: Negative for vomiting.  Musculoskeletal: Positive for arthralgias.  All  other systems reviewed and are negative.    Physical Exam Updated Vital Signs BP 140/78 (BP Location: Right Arm)   Pulse 72   Temp 98.6 F (37 C) (Oral)   Resp 20   SpO2 95%   Physical Exam  Constitutional: He appears well-developed and well-nourished. No distress.  Well appearing  HENT:  Head: Normocephalic and atraumatic.  Nose: Nose normal.  Mouth/Throat: Oropharynx is clear and moist.  Eyes: Conjunctivae and EOM are normal. Pupils are equal, round, and reactive to light.  Neck: Normal range of motion. No JVD present. No tracheal deviation present.  Cardiovascular: Normal rate, normal heart sounds and intact distal pulses.   No murmur heard. 2+ distal pulses.   Pulmonary/Chest: Effort normal and breath sounds normal. No stridor. No respiratory distress. He has no wheezes. He has no rales.  Normal work of breathing. No respiratory distress noted.   Abdominal: Soft. Bowel sounds are normal. There is no tenderness. There is no rebound and no guarding.  Soft and nontender. Negative Murphy sign. No focal tenderness at McBurney's point. No CVA tenderness.  Musculoskeletal: Normal range of motion.  Tenderness over right side of the chest and upper right back over the latissimus dorsi. No redness, bruising, swelling, wound, break in skin, deformity noted. FROM of BUE. No TTP, redness, swelling, deformity to BUE.   Neurological: He is alert. He has normal strength. No sensory deficit.  Skin: Skin is warm. Capillary refill takes less than 2 seconds. No erythema.  Psychiatric: He has a normal mood and affect. His behavior is normal.  Nursing note and vitals reviewed.    ED Treatments / Results  DIAGNOSTIC STUDIES: Oxygen Saturation is 95% on RA, adequate by my interpretation.    COORDINATION OF CARE: 12:19 PM Discussed treatment plan with pt at bedside and pt agreed to plan.   Labs (all labs ordered are listed, but only abnormal results are displayed) Labs Reviewed  CBC -  Abnormal; Notable for the following:       Result Value   HCT 38.9 (*)    All other components within normal limits  I-STAT CHEM 8, ED - Abnormal; Notable for the following:    BUN 22 (*)    Hemoglobin 12.2 (*)    HCT 36.0 (*)    All other components within normal limits  BASIC METABOLIC PANEL  BRAIN NATRIURETIC PEPTIDE  TROPONIN I    EKG  EKG Interpretation None       Radiology Dg Chest 2 View  Result Date: 02/02/2017 CLINICAL DATA:  56 year old male with chest pain and cough for 2 days. EXAM: CHEST  2 VIEW COMPARISON:  04/25/2009 chest CT FINDINGS: This is a low volume film. Streaky and ill-defined opacity within the left lower lobe may represent pneumonia versus atelectasis. A small right pleural effusion is noted. Cardiomediastinal silhouette is unchanged. Minimal right basilar atelectasis is noted. There is no evidence of pneumothorax or acute bony abnormality. IMPRESSION: Streaky and ill-defined left  lower lobe opacity which may represent pneumonia versus atelectasis. Small right pleural effusion. Electronically Signed   By: Margarette Canada M.D.   On: 02/02/2017 13:23   Ct Angio Chest Pe W/cm &/or Wo Cm  Result Date: 02/02/2017 CLINICAL DATA:  Persistent right rib and shoulder pain beginning 3 days ago after episode of choking while eating. Pain worse with deep inspiration, coughing and sneezing. Mild hemoptysis. EXAM: CT ANGIOGRAPHY CHEST WITH CONTRAST TECHNIQUE: Multidetector CT imaging of the chest was performed using the standard protocol during bolus administration of intravenous contrast. Multiplanar CT image reconstructions and MIPs were obtained to evaluate the vascular anatomy. CONTRAST:  100 mL Isovue 370 IV. COMPARISON:  CT 04/25/2009 FINDINGS: Cardiovascular: Heart is normal in size. Thoracic aorta is within normal. Pulmonary arterial system is well opacified no evidence of left-sided pulmonary emboli. Findings suggest small emboli over the right lower lobe pulmonary artery  lateral basilar segment. RV/LV ratio is 43.2/ 50.8 = 0.83. Mediastinum/Nodes: 1 cm right peritracheal lymph node likely reactive. Possible 1.1 cm precarinal and subcarinal lymph nodes likely reactive. Adenopathy. Remaining mediastinal structures are unremarkable. Lungs/Pleura: Lungs are adequately inflated with heterogeneous airspace opacification over the right lower lobe with small amount right pleural fluid. Minimal linear atelectasis over the right middle lobe and lingula. Airways are within normal. Upper Abdomen: Minimal cholelithiasis. Musculoskeletal: Unremarkable. Review of the MIP images confirms the above findings. IMPRESSION: Findings suggest small emboli over the right lower lobe pulmonary artery. Heterogeneous airspace opacification over the right lower lobe with small right pleural effusion. Findings may be due to atelectasis and infarction, although cannot exclude pneumonia. Mild mediastinal adenopathy likely reactive. Followup CTA in 24 hours may be helpful to confirm the right lower lobe emboli. Minimal cholelithiasis. Critical Value/emergent results were called by telephone at the time of interpretation on 02/02/2017 at 3:57 pm to Dr. Flonnie Overman , who verbally acknowledged these results. Electronically Signed   By: Marin Olp M.D.   On: 02/02/2017 15:47    Procedures Procedures (including critical care time)  Medications Ordered in ED Medications  morphine 4 MG/ML injection 4 mg (not administered)  iopamidol (ISOVUE-370) 76 % injection 100 mL (100 mLs Intravenous Contrast Given 02/02/17 1517)     Initial Impression / Assessment and Plan / ED Course  I have reviewed the triage vital signs and the nursing notes.  Pertinent labs & imaging results that were available during my care of the patient were reviewed by me and considered in my medical decision making (see chart for details).     CT shows findings suggest small emboli over the right lower lobe pulmonary artery. Small  right pleural effusion. Findings may be due to atelectasis and infarction although cannot exclude pneumonia. Radiologist suggested she CTA in 24 hours may be helpful to confirm right lower lobe emboli.  Patient has complaints of right-sided chest wall pain and right upper back and shoulder pain. He is in no apparent distress, afebrile, hemodynamically stable. Heart and lung sounds are clear. I-STAT Chem-8 and CBC are within normal limits. X-ray showed small pleural effusion on the right side and possible pneumonia to left side. The small pleural effusion was concerning and therefore CTA was ordered.  Dr. Rosary Lively also saw and evaluated patient and agrees with assessment and plan.  I spoke with Dr. Erlinda Hong, hospitalist, who agreed to admit pt for further evaluation and treatment. She also recommended to have pt started on heparin drip.    Final Clinical Impressions(s) / ED Diagnoses  Final diagnoses:  Other acute pulmonary embolism without acute cor pulmonale (HCC)    New Prescriptions New Prescriptions   No medications on file   I personally performed the services described in this documentation, which was scribed in my presence. The recorded information has been reviewed and is accurate.   Flonnie Overman [place], [place] 02/02/17 [place], Wenda Overland, MD 02/05/17 [phone]

## 2017-02-02 NOTE — Progress Notes (Signed)
ANTICOAGULATION CONSULT NOTE - Initial Consult  Pharmacy Consult for heparin Indication: pulmonary embolus  No Known Allergies  Patient Measurements:   Heparin Dosing Weight: 93kg  Vital Signs: Temp: 98.6 F (37 C) (05/18 1143) Temp Source: Oral (05/18 1143) BP: 153/86 (05/18 1640) Pulse Rate: 79 (05/18 1640)  Labs:  Recent Labs  02/02/17 1407 02/02/17 1414 02/02/17 1442  HGB 13.6  --  12.2*  HCT 38.9*  --  36.0*  PLT 229  --   --   CREATININE  --  0.77 0.80  TROPONINI  --  <0.03  --     CrCl cannot be calculated (Unknown ideal weight.).   Medical History: History reviewed. No pertinent past medical history.   Assessment: 68 YOF presents with rib/shoulder pain with an episode of blood-streaked sputum. CTA of chest reveals small RLL pulmonary emboli. Radiologist recommends repeat CTA in 24h to confirm PE.  Pharmacy asked to dose heparin.   Today, 02/02/2017  CBC: mild anemia and pltc WNL  Renal: SCr WNL  Baseline INR = 1, aPTT = 33sec  Goal of Therapy:  Heparin level 0.3-0.7 units/ml Monitor platelets by anticoagulation protocol: Yes   Plan:   Heparin 4000 unit bolus then 1600 units/hr  Check 6h heparin level  Daily heparin level and CBC  Monitor for bleeding  Await long-term plan for anticoagulation with f/u CTA  Doreene Eland, PharmD, BCPS.   Pager: [phone] 02/02/2017 4:57 PM

## 2017-02-02 NOTE — ED Notes (Signed)
Bed: CH88 Expected date:  Expected time:  Means of arrival:  Comments: Taking care of FT 5

## 2017-02-02 NOTE — H&P (Signed)
History and Physical  Culley Hedeen BVQ:[id number] DOB: 03-Apr-1961 DOA: 02/02/2017  Referring physician: EDP PCP: Cathleen Corti, PA-C   Chief Complaint: right sided chest pain/shoulder pain, hemoptysis  HPI: Guy Walker is a 56 y.o. male   With no significant past medical history presented to [facility] ED due to above complaints. Pt reports persistent right sided hest wall pain, and right sided shoulder pain x 2 days. Seen at clinic yesterday. Rx for muscle relaxant, antibiotic and anti-inflammatory. Pt reports coughing up threads of blood since yesterday. Pt is alert oriented and ambulatory.   ED course: blood pressure elevated, sbp around 150's, no hypoxia , no tachycardia, no fever.  Tele with NRS, Basic labs unremarkable,  CTA"  Findings suggest small emboli over the right lower lobe pulmonary artery. Heterogeneous airspace opacification over the right lower lobe with small right pleural effusion. Findings may be due to atelectasis and infarction, although cannot exclude pneumonia. Mild mediastinal adenopathy likely reactive. Followup CTA in 24 hours may be helpful to confirm the right lower lobe emboli."  Patient is started on heparin drip, hospitalist called to observe and treat the patient.  Patient otherwise no fever, no edema, no n/v. He denies recent trauma. He is active at baseline. Denies recent h/o travel, no family history of clotting disorders. He does smoke but have been cutting down.   Review of Systems:  Detail per HPI, Review of systems are otherwise negative  History reviewed. No pertinent past medical history. Past Surgical History:  Procedure Laterality Date  . INCISION AND DRAINAGE ABSCESS N/A 10/30/2013   Procedure: INCISION AND DRAINAGE ABSCESS LEFT INNER GROIN;  Surgeon: Pedro Earls, MD;  Location: [facility] ORS;  Service: General;  Laterality: N/A;  . Nodules removed from throat  1970's   Social History:  reports that he has been smoking  Cigarettes.  He has been smoking about 0.25 packs per day. He has never used smokeless tobacco. He reports that he drinks about 2.4 oz of alcohol per week . He reports that he does not use drugs. Patient lives at home & is able to participate in activities of daily living independently   No Known Allergies  Family History  Problem Relation Age of Onset  . Hypertension Mother   . Heart failure Father       Prior to Admission medications   Medication Sig Start Date End Date Taking? Authorizing Provider  acetaminophen (TYLENOL) 325 MG tablet Take 650 mg by mouth every 6 (six) hours as needed.   Yes [provider]  azithromycin (ZITHROMAX) 500 MG tablet Take 500 mg by mouth daily.   Yes [provider]  cyclobenzaprine (FLEXERIL) 10 MG tablet Take 10 mg by mouth at bedtime.   Yes [provider]  diclofenac (VOLTAREN) 75 MG EC tablet Take 75 mg by mouth 2 (two) times daily.   Yes [provider]  ibuprofen (ADVIL,MOTRIN) 800 MG tablet Take 1 tablet (800 mg total) by mouth 3 (three) times daily. 01/05/17  Yes Law, Bea Graff, PA-C  methocarbamol (ROBAXIN) 500 MG tablet Take 1 tablet (500 mg total) by mouth 2 (two) times daily. Patient not taking: Reported on 02/02/2017 01/05/17   Frederica Kuster, PA-C    Physical Exam: BP (!) 156/91 (BP Location: Right Arm)   Pulse 69   Temp 98.6 F (37 C) (Oral)   Resp (!) 30   Wt 93 kg (205 lb)   SpO2 97%   BMI 30.27 kg/m   General:  In pain, not in respiratory distress, tender to palpation posterior right shoulder blade, tender to palpation right sided chest wall, no significant skin findings, able to lift shoulder up  Eyes: PERRL ENT: unremarkable Neck: supple, no JVD Cardiovascular: RRR Respiratory: significant pleuritic chest pain, not able to take deep breath, does has mild crackles on right side, left side clear Abdomen: soft/ND/ND, positive bowel sounds Skin: no rash Musculoskeletal:  No  edema Psychiatric: calm/cooperative Neurologic: no focal findings            Labs on Admission:  Basic Metabolic Panel:  Recent Labs Lab 02/02/17 1414 02/02/17 1442  NA 141 142  K 4.3 4.2  CL 109 108  CO2 24  --   GLUCOSE 95 97  BUN 21* 22*  CREATININE 0.77 0.80  CALCIUM 9.1  --    Liver Function Tests: No results for input(s): AST, ALT, ALKPHOS, BILITOT, PROT, ALBUMIN in the last 168 hours. No results for input(s): LIPASE, AMYLASE in the last 168 hours. No results for input(s): AMMONIA in the last 168 hours. CBC:  Recent Labs Lab 02/02/17 1407 02/02/17 1442  WBC 8.6  --   HGB 13.6 12.2*  HCT 38.9* 36.0*  MCV 88.0  --   PLT 229  --    Cardiac Enzymes:  Recent Labs Lab 02/02/17 1414  TROPONINI <0.03    BNP (last 3 results)  Recent Labs  02/02/17 1414  BNP 36.0    ProBNP (last 3 results) No results for input(s): PROBNP in the last 8760 hours.  CBG: No results for input(s): GLUCAP in the last 168 hours.  Radiological Exams on Admission: Dg Chest 2 View  Result Date: 02/02/2017 CLINICAL DATA:  56 year old male with chest pain and cough for 2 days. EXAM: CHEST  2 VIEW COMPARISON:  04/25/2009 chest CT FINDINGS: This is a low volume film. Streaky and ill-defined opacity within the left lower lobe may represent pneumonia versus atelectasis. A small right pleural effusion is noted. Cardiomediastinal silhouette is unchanged. Minimal right basilar atelectasis is noted. There is no evidence of pneumothorax or acute bony abnormality. IMPRESSION: Streaky and ill-defined left lower lobe opacity which may represent pneumonia versus atelectasis. Small right pleural effusion. Electronically Signed   By: Margarette Canada M.D.   On: 02/02/2017 13:23   Ct Angio Chest Pe W/cm &/or Wo Cm  Result Date: 02/02/2017 CLINICAL DATA:  Persistent right rib and shoulder pain beginning 3 days ago after episode of choking while eating. Pain worse with deep inspiration, coughing and  sneezing. Mild hemoptysis. EXAM: CT ANGIOGRAPHY CHEST WITH CONTRAST TECHNIQUE: Multidetector CT imaging of the chest was performed using the standard protocol during bolus administration of intravenous contrast. Multiplanar CT image reconstructions and MIPs were obtained to evaluate the vascular anatomy. CONTRAST:  100 mL Isovue 370 IV. COMPARISON:  CT 04/25/2009 FINDINGS: Cardiovascular: Heart is normal in size. Thoracic aorta is within normal. Pulmonary arterial system is well opacified no evidence of left-sided pulmonary emboli. Findings suggest small emboli over the right lower lobe pulmonary artery lateral basilar segment. RV/LV ratio is 43.2/ 50.8 = 0.83. Mediastinum/Nodes: 1 cm right peritracheal lymph node likely reactive. Possible 1.1 cm precarinal and subcarinal lymph nodes likely reactive. Adenopathy. Remaining mediastinal structures are unremarkable. Lungs/Pleura: Lungs are adequately inflated with heterogeneous airspace opacification over the right lower lobe with small amount right pleural fluid. Minimal linear atelectasis over the right middle lobe and lingula. Airways are within normal. Upper Abdomen: Minimal cholelithiasis. Musculoskeletal: Unremarkable. Review of the MIP images  confirms the above findings. IMPRESSION: Findings suggest small emboli over the right lower lobe pulmonary artery. Heterogeneous airspace opacification over the right lower lobe with small right pleural effusion. Findings may be due to atelectasis and infarction, although cannot exclude pneumonia. Mild mediastinal adenopathy likely reactive. Followup CTA in 24 hours may be helpful to confirm the right lower lobe emboli. Minimal cholelithiasis. Critical Value/emergent results were called by telephone at the time of interpretation on 02/02/2017 at 3:57 pm to Dr. Flonnie Overman , who verbally acknowledged these results. Electronically Signed   By: Marin Olp M.D.   On: 02/02/2017 15:47     Assessment/Plan Present on  Admission: . Pulmonary emboli ([place])  ? PE,  Patent does has significant pleuritic chest pain and mild hemoptysis, no tachycardia, no hypoxia, no hypotension. empirically start heparin drip started from ED, repeat cta in 24hrs per radiology recommendation. Will get echocardiogram and venous doppler ( though not edema on exam) Observation , med tele bed Will hold off abx for now, Should patient spike fever, consider cultures and abx.  Prn percocet for pain control.   Elevated blood pressure: no prior diagnosis of HTN Possible from acute illness Prn hydralazine for sbp>170, Monitor,   DVT prophylaxis: on heparin drip  Consultants: none  Code Status: full   Family Communication:  Daughter over the phone  Disposition Plan: observation, tele bed  Time spent: 27mins  Khamari Sheehan MD, PhD [facility] Pager [phone] If 7PM-7AM, please contact night-coverage at [email or web address], password [facility]

## 2017-02-03 ENCOUNTER — Observation Stay (HOSPITAL_COMMUNITY): Payer: Self-pay

## 2017-02-03 ENCOUNTER — Observation Stay (HOSPITAL_BASED_OUTPATIENT_CLINIC_OR_DEPARTMENT_OTHER): Payer: Self-pay

## 2017-02-03 DIAGNOSIS — I5030 Unspecified diastolic (congestive) heart failure: Secondary | ICD-10-CM

## 2017-02-03 DIAGNOSIS — R0781 Pleurodynia: Secondary | ICD-10-CM

## 2017-02-03 LAB — COMPREHENSIVE METABOLIC PANEL WITH GFR
ALT: 28 U/L (ref 17–63)
AST: 24 U/L (ref 15–41)
Albumin: 3.4 g/dL — ABNORMAL LOW (ref 3.5–5.0)
Alkaline Phosphatase: 126 U/L (ref 38–126)
Anion gap: 8 (ref 5–15)
BUN: 19 mg/dL (ref 6–20)
CO2: 24 mmol/L (ref 22–32)
Calcium: 8.6 mg/dL — ABNORMAL LOW (ref 8.9–10.3)
Chloride: 106 mmol/L (ref 101–111)
Creatinine, Ser: 0.81 mg/dL (ref 0.61–1.24)
GFR calc Af Amer: >60 mL/min
GFR calc non Af Amer: >60 mL/min
Glucose, Bld: 211 mg/dL — ABNORMAL HIGH (ref 65–99)
Potassium: 3.6 mmol/L (ref 3.5–5.1)
Sodium: 138 mmol/L (ref 135–145)
Total Bilirubin: 0.2 mg/dL — ABNORMAL LOW (ref 0.3–1.2)
Total Protein: 6.6 g/dL (ref 6.5–8.1)

## 2017-02-03 LAB — CBC
HEMATOCRIT: 38 % — AB (ref 39.0–52.0)
Hemoglobin: 12.7 g/dL — ABNORMAL LOW (ref 13.0–17.0)
MCH: 29.6 pg (ref 26.0–34.0)
MCHC: 33.4 g/dL (ref 30.0–36.0)
MCV: 88.6 fL (ref 78.0–100.0)
PLATELETS: 231 10*3/uL (ref 150–400)
RBC: 4.29 MIL/uL (ref 4.22–5.81)
RDW: 13.4 % (ref 11.5–15.5)
WBC: 8.9 10*3/uL (ref 4.0–10.5)

## 2017-02-03 LAB — HIV ANTIBODY (ROUTINE TESTING W REFLEX): HIV Screen 4th Generation wRfx: NONREACTIVE

## 2017-02-03 LAB — ECHOCARDIOGRAM COMPLETE
Height: 69 in
Weight: 3280 oz

## 2017-02-03 LAB — HEPARIN LEVEL (UNFRACTIONATED)
Heparin Unfractionated: 0.39 IU/mL (ref 0.30–0.70)
Heparin Unfractionated: 0.37 [IU]/mL (ref 0.30–0.70)

## 2017-02-03 LAB — LACTATE DEHYDROGENASE: LDH: 291 U/L — AB (ref 98–192)

## 2017-02-03 MED ORDER — MORPHINE SULFATE (PF) 4 MG/ML IV SOLN: 2.0000 mg | INTRAVENOUS | Status: DC | PRN

## 2017-02-03 MED ORDER — MORPHINE SULFATE (PF) 4 MG/ML IV SOLN
1.0000 mg | INTRAVENOUS | Status: DC | PRN
  Administered 2017-02-03: 1 mg via INTRAVENOUS
  Filled 2017-02-03: qty 1

## 2017-02-03 MED ORDER — HEPARIN BOLUS VIA INFUSION
2500.0000 [IU] | Freq: Once | INTRAVENOUS | Status: AC
  Administered 2017-02-03: 2500 [IU] via INTRAVENOUS
  Filled 2017-02-03: qty 2500

## 2017-02-03 MED ORDER — NICOTINE 14 MG/24HR TD PT24: 14.0000 mg | MEDICATED_PATCH | Freq: Every day | TRANSDERMAL | Status: DC | PRN

## 2017-02-03 MED ORDER — AMLODIPINE BESYLATE 5 MG PO TABS
5.0000 mg | ORAL_TABLET | Freq: Every day | ORAL | Status: DC
  Administered 2017-02-03 – 2017-02-05 (×3): 5 mg via ORAL
  Filled 2017-02-03 (×3): qty 1

## 2017-02-03 MED ORDER — DOCUSATE SODIUM 100 MG PO CAPS
200.0000 mg | ORAL_CAPSULE | Freq: Two times a day (BID) | ORAL | Status: DC
  Administered 2017-02-03 – 2017-02-05 (×5): 200 mg via ORAL
  Filled 2017-02-03 (×5): qty 2

## 2017-02-03 MED ORDER — HEPARIN (PORCINE) IN NACL 100-0.45 UNIT/ML-% IJ SOLN
2000.0000 [IU]/h | INTRAMUSCULAR | Status: DC
  Administered 2017-02-03 – 2017-02-05 (×5): 2000 [IU]/h via INTRAVENOUS
  Filled 2017-02-03 (×5): qty 250

## 2017-02-03 MED ORDER — IOPAMIDOL (ISOVUE-370) INJECTION 76%
INTRAVENOUS | Status: AC
  Administered 2017-02-03: 100 mL via INTRAVENOUS
  Filled 2017-02-03: qty 100

## 2017-02-03 MED ORDER — OXYCODONE-ACETAMINOPHEN 5-325 MG PO TABS
1.0000 | ORAL_TABLET | Freq: Four times a day (QID) | ORAL | Status: DC | PRN
  Administered 2017-02-03 – 2017-02-05 (×7): 2 via ORAL
  Filled 2017-02-03 (×7): qty 2

## 2017-02-03 MED ORDER — KETOROLAC TROMETHAMINE 30 MG/ML IJ SOLN
30.0000 mg | Freq: Four times a day (QID) | INTRAMUSCULAR | Status: AC
  Administered 2017-02-03 – 2017-02-04 (×5): 30 mg via INTRAVENOUS
  Filled 2017-02-03 (×5): qty 1

## 2017-02-03 NOTE — Progress Notes (Signed)
ANTICOAGULATION CONSULT NOTE - f/u Consult  Pharmacy Consult for heparin Indication: pulmonary embolus  No Known Allergies  Patient Measurements: Weight: 205 lb (93 kg) Heparin Dosing Weight: 93kg  Vital Signs: Temp: 98.9 F (37.2 C) (05/18 2033) BP: 161/86 (05/18 2033) Pulse Rate: 68 (05/18 2033)  Labs:  Recent Labs  02/02/17 1407 02/02/17 1414 02/02/17 1442 02/02/17 1730 02/03/17 0133  HGB 13.6  --  12.2*  --  12.7*  HCT 38.9*  --  36.0*  --  38.0*  PLT 229  --   --   --  231  APTT  --   --   --  33  --   LABPROT  --   --   --  13.2  --   INR  --   --   --  1.00  --   HEPARINUNFRC  --   --   --   --  <0.10*  CREATININE  --  0.77 0.80  --  0.81  TROPONINI  --  <0.03  --   --   --     Estimated Creatinine Clearance: 118.7 mL/min (by C-G formula based on SCr of 0.81 mg/dL).   Medical History: Past Medical History:  Diagnosis Date  . Hypertension      Assessment: 21 YOF presents with rib/shoulder pain with an episode of blood-streaked sputum. CTA of chest reveals small RLL pulmonary emboli. Radiologist recommends repeat CTA in 24h to confirm PE.  Pharmacy asked to dose heparin.    5/18  CBC: mild anemia and pltc WNL  Renal: SCr WNL  Baseline INR = 1, aPTT = 33sec Today, 5/19  0133 HL=<0.10 below goal, no infusion or bleeding issues per RN. (thorough check of IV site, no problems) Goal of Therapy:  Heparin level 0.3-0.7 units/ml Monitor platelets by anticoagulation protocol: Yes   Plan:   Rebolus with 2500 units of IV heparin  Increase drip to 2000 units/hr  Check 6h heparin level  Daily heparin level and CBC  Monitor for bleeding  Await long-term plan for anticoagulation with f/u CTA   02/03/2017 3:55 AM

## 2017-02-03 NOTE — Progress Notes (Addendum)
ANTICOAGULATION CONSULT NOTE - f/u Consult  Pharmacy Consult for heparin Indication: pulmonary embolus  No Known Allergies  Patient Measurements: Height: 5' 9"  (175.3 cm) Weight: 205 lb (93 kg) IBW/kg (Calculated) : 70.7 Heparin Dosing Weight: 89.8 kg  Vital Signs: Temp: 98.3 F (36.8 C) (05/19 0528) Temp Source: Oral (05/19 0528) BP: 148/80 (05/19 0528) Pulse Rate: 60 (05/19 0528)  Labs:  Recent Labs  02/02/17 1407 02/02/17 1414 02/02/17 1442 02/02/17 1730 02/03/17 0133 02/03/17 1111  HGB 13.6  --  12.2*  --  12.7*  --   HCT 38.9*  --  36.0*  --  38.0*  --   PLT 229  --   --   --  231  --   APTT  --   --   --  33  --   --   LABPROT  --   --   --  13.2  --   --   INR  --   --   --  1.00  --   --   HEPARINUNFRC  --   --   --   --  <0.10* 0.39  CREATININE  --  0.77 0.80  --  0.81  --   TROPONINI  --  <0.03  --   --   --   --     Estimated Creatinine Clearance: 118.7 mL/min (by C-G formula based on SCr of 0.81 mg/dL).   Medical History: Past Medical History:  Diagnosis Date  . Hypertension      Assessment: 45 YOF presents with rib/shoulder pain with an episode of blood-streaked sputum. CTa of chest reveals small RLL pulmonary emboli. Radiologist recommends repeat CTa in 24h to confirm PE (scheduled for 4pm today).  Pharmacy asked to dose IV heparin.   Baseline PT/INR 13.2/1, aPTT 33 seconds  Today, 02/03/17  1111 heparin level = 0.39 units/mL, therapeutic on heparin infusion at 2000 units/hr  CBC: Hgb stable, Pltc WNL  No bleeding or infusion issues reported per nursing  Goal of Therapy:  Heparin level 0.3-0.7 units/ml Monitor platelets by anticoagulation protocol: Yes   Plan:   Continue heparin infusion at 2000 units/hr  Check confirmatory heparin level in 6 hours to ensure remains therapeutic.  F/u repeat CTa of chest.  Daily CBC and heparin level while on heparin infusion.   Monitor closely for s/sx of bleeding.  Await long-term plan for  anticoagulation with f/u CTa.   Lindell Spar, PharmD, BCPS Pager: [phone] 02/03/2017 12:20 PM   Addendum:   Repeat CT scan confirms segmental PE to the RLL and noted to have multiple small vertebral bone lesions concerning for lytic metastatic disease or multiple myeloma.   Heparin level at 1748 = 0.37 units/mL, remains therapeutic on heparin infusion at 2000 units/hr.  No bleeding or complications of therapy noted per nursing.   Plan:  Continue heparin infusion at 2000 units/hr.  Daily CBC and heparin level.  F/u long-term anticoagulation plans.   Monitor for bleeding.   Lindell Spar, PharmD, BCPS Pager: [phone] 02/03/2017 6:37 PM

## 2017-02-03 NOTE — Progress Notes (Signed)
02/03/2017 5:07 PM  Reviewed CTA results. Pt noted to have lytic lesions in the vertebral spine concerning for metastasis or multiple myeloma.  He will need further work up and oncology consultation.    C. Johnson MD 

## 2017-02-03 NOTE — Progress Notes (Signed)
PROGRESS NOTE    Guy Walker  OZH:[id number]  DOB: June 07, 1961  DOA: 02/02/2017 PCP: Cathleen Corti, PA-C Outpatient Specialists:   Hospital course: 56 y/o spanish speaking male presented to ED with acute pleuritic right chest wall pain and hemoptysis and weakly positive CTA for small pulmonary emboli - started on IV heparin infusion.   Assessment & Plan:   Acute Pulmonary emboli  Patent continues to have pleuritic chest pain and presented with mild hemoptysis, no tachycardia, no hypoxia, no hypotension. Continue heparin drip started from ED, repeat cta in 24hrs per radiology recommendation - ordered to be repeated at 4pm today. Echocardiogram pending and venous doppler ( though not edema on exam) pending.  Observation , med tele bed Add scheduled ketorolac for pleuritic pain.  Elevated blood pressure: no prior diagnosis of HTN Likely exacerbated by uncontrolled pain.  See above. Add amlodipine 5 mg daily    Prn hydralazine for sbp>170, Monitor  Tobacco Abuse - counseled on tobacco cessation.    DVT prophylaxis: on heparin drip  Consultants: none  Code Status: full   Family Communication:  Daughter bedside  Disposition Plan: observation, tele bed  Subjective: Pt still having a lot of right sided pleuritic chest wall pain with breathing.  Denies shortness of breath.  Denies fever.  Still coughing.   Objective: Vitals:   02/02/17 1758 02/02/17 1828 02/02/17 2033 02/03/17 0528  BP:  (!) 156/91 (!) 161/86 (!) 148/80  Pulse:  69 68 60  Resp:  (!) 30 (!) 24 20  Temp:   98.9 F (37.2 C) 98.3 F (36.8 C)  TempSrc:    Oral  SpO2:  97% 97% 98%  Weight: 93 kg (205 lb)     Height: 5\' 9"  (1.753 m)       Intake/Output Summary (Last 24 hours) at 02/03/17 1203 Last data filed at 02/03/17 0600  Gross per 24 hour  Intake               49 ml  Output                0 ml  Net               49 ml   Filed Weights   02/02/17 1758  Weight: 93 kg (205  lb)    Exam:  General exam: awake, alert, NAD, cooperative.  Respiratory system:  No increased work of breathing. No wheezing heard.  Chest Wall: no rash seen on chest wall to suggest herpes. Cardiovascular system: S1 & S2 heard, RRR. No JVD, murmurs, gallops, clicks or pedal edema. Gastrointestinal system: Abdomen is nondistended, soft and nontender. Normal bowel sounds heard. Central nervous system: Alert and oriented. No focal neurological deficits. Extremities: no CCE.  Data Reviewed: Basic Metabolic Panel:  Recent Labs Lab 02/02/17 1414 02/02/17 1442 02/03/17 0133  NA 141 142 138  K 4.3 4.2 3.6  CL 109 108 106  CO2 24  --  24  GLUCOSE 95 97 211*  BUN 21* 22* 19  CREATININE 0.77 0.80 0.81  CALCIUM 9.1  --  8.6*   Liver Function Tests:  Recent Labs Lab 02/03/17 0133  AST 24  ALT 28  ALKPHOS 126  BILITOT 0.2*  PROT 6.6  ALBUMIN 3.4*   No results for input(s): LIPASE, AMYLASE in the last 168 hours. No results for input(s): AMMONIA in the last 168 hours. CBC:  Recent Labs Lab 02/02/17 1407 02/02/17 1442 02/03/17 0133  WBC 8.6  --  8.9  HGB 13.6 12.2* 12.7*  HCT 38.9* 36.0* 38.0*  MCV 88.0  --  88.6  PLT 229  --  231   Cardiac Enzymes:  Recent Labs Lab 02/02/17 1414  TROPONINI <0.03   CBG (last 3)  No results for input(s): GLUCAP in the last 72 hours. Recent Results (from the past 240 hour(s))  MRSA PCR Screening     Status: None   Collection Time: 02/02/17  9:06 PM  Result Value Ref Range Status   MRSA by PCR NEGATIVE NEGATIVE Final    Comment:        The GeneXpert MRSA Assay (FDA approved for NASAL specimens only), is one component of a comprehensive MRSA colonization surveillance program. It is not intended to diagnose MRSA infection nor to guide or monitor treatment for MRSA infections.      Studies: Dg Chest 2 View  Result Date: 02/02/2017 CLINICAL DATA:  56 year old male with chest pain and cough for 2 days. EXAM: CHEST  2  VIEW COMPARISON:  04/25/2009 chest CT FINDINGS: This is a low volume film. Streaky and ill-defined opacity within the left lower lobe may represent pneumonia versus atelectasis. A small right pleural effusion is noted. Cardiomediastinal silhouette is unchanged. Minimal right basilar atelectasis is noted. There is no evidence of pneumothorax or acute bony abnormality. IMPRESSION: Streaky and ill-defined left lower lobe opacity which may represent pneumonia versus atelectasis. Small right pleural effusion. Electronically Signed   By: Margarette Canada M.D.   On: 02/02/2017 13:23   Ct Angio Chest Pe W/cm &/or Wo Cm  Result Date: 02/02/2017 CLINICAL DATA:  Persistent right rib and shoulder pain beginning 3 days ago after episode of choking while eating. Pain worse with deep inspiration, coughing and sneezing. Mild hemoptysis. EXAM: CT ANGIOGRAPHY CHEST WITH CONTRAST TECHNIQUE: Multidetector CT imaging of the chest was performed using the standard protocol during bolus administration of intravenous contrast. Multiplanar CT image reconstructions and MIPs were obtained to evaluate the vascular anatomy. CONTRAST:  100 mL Isovue 370 IV. COMPARISON:  CT 04/25/2009 FINDINGS: Cardiovascular: Heart is normal in size. Thoracic aorta is within normal. Pulmonary arterial system is well opacified no evidence of left-sided pulmonary emboli. Findings suggest small emboli over the right lower lobe pulmonary artery lateral basilar segment. RV/LV ratio is 43.2/ 50.8 = 0.83. Mediastinum/Nodes: 1 cm right peritracheal lymph node likely reactive. Possible 1.1 cm precarinal and subcarinal lymph nodes likely reactive. Adenopathy. Remaining mediastinal structures are unremarkable. Lungs/Pleura: Lungs are adequately inflated with heterogeneous airspace opacification over the right lower lobe with small amount right pleural fluid. Minimal linear atelectasis over the right middle lobe and lingula. Airways are within normal. Upper Abdomen: Minimal  cholelithiasis. Musculoskeletal: Unremarkable. Review of the MIP images confirms the above findings. IMPRESSION: Findings suggest small emboli over the right lower lobe pulmonary artery. Heterogeneous airspace opacification over the right lower lobe with small right pleural effusion. Findings may be due to atelectasis and infarction, although cannot exclude pneumonia. Mild mediastinal adenopathy likely reactive. Followup CTA in 24 hours may be helpful to confirm the right lower lobe emboli. Minimal cholelithiasis. Critical Value/emergent results were called by telephone at the time of interpretation on 02/02/2017 at 3:57 pm to Dr. Flonnie Overman , who verbally acknowledged these results. Electronically Signed   By: Marin Olp M.D.   On: 02/02/2017 15:47   Scheduled Meds: . ketorolac  30 mg Intravenous Q6H  . sodium chloride flush  3 mL Intravenous Q12H   Continuous Infusions: . heparin 2,000 Units/hr (02/03/17 0448)  Active Problems:   Pulmonary emboli ([place])  Time spent:   Irwin Brakeman, MD, FAAFP [facility] Pager [phone]  If 7PM-7AM, please contact night-coverage [email or web address] Password [email or web address] 02/03/2017, 12:03 PM    LOS: 0 days

## 2017-02-03 NOTE — Progress Notes (Signed)
  Echocardiogram 2D Echocardiogram has been performed.  Guy Walker 02/03/2017, 9:17 AM

## 2017-02-04 ENCOUNTER — Inpatient Hospital Stay (HOSPITAL_COMMUNITY): Payer: Self-pay

## 2017-02-04 ENCOUNTER — Inpatient Hospital Stay (HOSPITAL_COMMUNITY): Payer: No Typology Code available for payment source

## 2017-02-04 DIAGNOSIS — Z86711 Personal history of pulmonary embolism: Secondary | ICD-10-CM

## 2017-02-04 DIAGNOSIS — I2699 Other pulmonary embolism without acute cor pulmonale: Principal | ICD-10-CM

## 2017-02-04 DIAGNOSIS — C801 Malignant (primary) neoplasm, unspecified: Secondary | ICD-10-CM

## 2017-02-04 DIAGNOSIS — I10 Essential (primary) hypertension: Secondary | ICD-10-CM

## 2017-02-04 DIAGNOSIS — M899 Disorder of bone, unspecified: Secondary | ICD-10-CM

## 2017-02-04 LAB — CBC
HEMATOCRIT: 39 % (ref 39.0–52.0)
Hemoglobin: 13.2 g/dL (ref 13.0–17.0)
MCH: 29.5 pg (ref 26.0–34.0)
MCHC: 33.8 g/dL (ref 30.0–36.0)
MCV: 87.1 fL (ref 78.0–100.0)
PLATELETS: 237 10*3/uL (ref 150–400)
RBC: 4.48 MIL/uL (ref 4.22–5.81)
RDW: 13.1 % (ref 11.5–15.5)
WBC: 8 10*3/uL (ref 4.0–10.5)

## 2017-02-04 LAB — COMPREHENSIVE METABOLIC PANEL
ALT: 33 U/L (ref 17–63)
AST: 26 U/L (ref 15–41)
Albumin: 3.3 g/dL — ABNORMAL LOW (ref 3.5–5.0)
Alkaline Phosphatase: 127 U/L — ABNORMAL HIGH (ref 38–126)
Anion gap: 9 (ref 5–15)
BUN: 15 mg/dL (ref 6–20)
CHLORIDE: 100 mmol/L — AB (ref 101–111)
CO2: 24 mmol/L (ref 22–32)
CREATININE: 0.79 mg/dL (ref 0.61–1.24)
Calcium: 8.7 mg/dL — ABNORMAL LOW (ref 8.9–10.3)
GFR calc Af Amer: >60 mL/min (ref >60)
Glucose, Bld: 94 mg/dL (ref 65–99)
Potassium: 4.3 mmol/L (ref 3.5–5.1)
Sodium: 133 mmol/L — ABNORMAL LOW (ref 135–145)
Total Bilirubin: 0.5 mg/dL (ref 0.3–1.2)
Total Protein: 6.7 g/dL (ref 6.5–8.1)

## 2017-02-04 LAB — PSA: PSA: 0.16 ng/mL (ref 0.00–4.00)

## 2017-02-04 LAB — SEDIMENTATION RATE: SED RATE: 88 mm/h — AB (ref 0–16)

## 2017-02-04 LAB — HEPARIN LEVEL (UNFRACTIONATED): Heparin Unfractionated: 0.44 IU/mL (ref 0.30–0.70)

## 2017-02-04 LAB — HEMOGLOBIN A1C
Hgb A1c MFr Bld: 5.5 % (ref 4.8–5.6)
Mean Plasma Glucose: 111 mg/dL

## 2017-02-04 LAB — C-REACTIVE PROTEIN: CRP: 11.4 mg/dL — ABNORMAL HIGH (ref <1.0)

## 2017-02-04 MED ORDER — IOPAMIDOL (ISOVUE-300) INJECTION 61%
INTRAVENOUS | Status: AC
  Administered 2017-02-04: 30 mL via ORAL
  Filled 2017-02-04: qty 30

## 2017-02-04 MED ORDER — IOPAMIDOL (ISOVUE-300) INJECTION 61%
INTRAVENOUS | Status: AC
  Administered 2017-02-04: 100 mL via INTRAVENOUS
  Filled 2017-02-04: qty 100

## 2017-02-04 NOTE — Progress Notes (Signed)
VASCULAR LAB PRELIMINARY  PRELIMINARY  PRELIMINARY  PRELIMINARY  Bilateral lower extremity venous duplex completed.    Preliminary report:  Bilateral:  No evidence of DVT, superficial thrombosis, or Baker's Cyst.   Jassiel Flye, RVS 02/04/2017, 9:21 AM

## 2017-02-04 NOTE — Progress Notes (Signed)
ANTICOAGULATION CONSULT NOTE - f/u Consult  Pharmacy Consult for heparin Indication: pulmonary embolus  No Known Allergies  Patient Measurements: Height: 5' 9"  (175.3 cm) Weight: 205 lb (93 kg) IBW/kg (Calculated) : 70.7 Heparin Dosing Weight: 89.8 kg  Vital Signs: Temp: 98.3 F (36.8 C) (05/20 0542) Temp Source: Oral (05/20 0542) BP: 148/83 (05/20 0542) Pulse Rate: 66 (05/20 0542)  Labs:  Recent Labs  02/02/17 1407  02/02/17 1414 02/02/17 1442 02/02/17 1730  02/03/17 0133 02/03/17 1111 02/03/17 1748 02/04/17 0546  HGB 13.6  --   --  12.2*  --   --  12.7*  --   --  13.2  HCT 38.9*  --   --  36.0*  --   --  38.0*  --   --  39.0  PLT 229  --   --   --   --   --  231  --   --  237  APTT  --   --   --   --  33  --   --   --   --   --   LABPROT  --   --   --   --  13.2  --   --   --   --   --   INR  --   --   --   --  1.00  --   --   --   --   --   HEPARINUNFRC  --   --   --   --   --   < > <0.10* 0.39 0.37 0.44  CREATININE  --   < > 0.77 0.80  --   --  0.81  --   --  0.79  TROPONINI  --   --  <0.03  --   --   --   --   --   --   --   < > = values in this interval not displayed.  Estimated Creatinine Clearance: 120.2 mL/min (by C-G formula based on SCr of 0.79 mg/dL).   Medical History: Past Medical History:  Diagnosis Date  . Hypertension      Assessment: 38 YOF presents with rib/shoulder pain with an episode of blood-streaked sputum. CTa of chest reveals small RLL pulmonary emboli. Radiologist recommended a repeat CTa and this confirmed segmental PE to the RLL.  CT also noted to have multiple small vertebral bone lesions concerning for lytic metastatic disease or multiple myeloma. Pharmacy dosing IV heparin. Baseline anticoagulation labs were WNL.  Today, 02/04/17  Heparin level = 0.44 units/mL, therapeutic on heparin infusion at 2000 units/hr  CBC: Hgb, Pltc WNL  No bleeding/complications reported.   Goal of Therapy:  Heparin level 0.3-0.7  units/ml Monitor platelets by anticoagulation protocol: Yes   Plan:   Continue heparin infusion at 2000 units/hr  Daily CBC and heparin level while on heparin infusion.   Monitor closely for s/sx of bleeding.  Await long-term plan for anticoagulation.  Hershal Coria, PharmD, BCPS Pager: [phone] 02/04/2017 7:55 AM

## 2017-02-04 NOTE — Progress Notes (Signed)
PROGRESS NOTE    Guy Walker  GGE:[id number]  DOB: [birth year]  DOA: 02/02/2017 PCP: Cathleen Corti, PA-C Outpatient Specialists:   Hospital course: 56 y/o spanish speaking male presented to ED with acute pleuritic right chest wall pain and hemoptysis and weakly positive CTA for small pulmonary emboli - started on IV heparin infusion.   Assessment & Plan:   Acute Pulmonary emboli  Patent presented with significant pleuritic right sided chest pain,  right shoulder pain ,and mild hemoptysis, no tachycardia, no hypoxia, no hypotension.  Echocardiogram " Normal LV systolic function; mild LVH; mild diastolic dysfunction; mild TR with mildly elevated pulmonary pressure.  venous doppler no DVT. repeat cta in 24hrs per radiology recommendation  Confirmed PE. Continue heparin drip started from ED, pain control   lytic bone lesions:  CT chest showed "Multiple small lucent vertebral bone lesions. These are concerning for lytic metastatic disease or multiple myeloma. Spep/upep pending  bone marrow biopsy ordered by oncology , oncology Dr Burr Medico input appreciated.   Elevated blood pressure: no prior diagnosis of HTN Likely exacerbated by uncontrolled pain.  See above. Add amlodipine 5 mg daily    Prn hydralazine for sbp>170, Monitor  Tobacco Abuse - counseled on tobacco cessation.    DVT prophylaxis: on heparin drip  Consultants: none  Code Status: full   Family Communication:  Daughter bedside  Disposition Plan:  likely home in 1-2 days  Subjective:  right sided pleuritic chest wall pain with breathing is improving, less cough,.  Denies shortness of breath.  Denies fever.    Daughter at bedside.  Objective: Vitals:   02/03/17 0528 02/03/17 1427 02/03/17 2142 02/04/17 0542  BP: (!) 148/80 (!) 159/92 (!) 166/90 (!) 148/83  Pulse: 60 64 63 66  Resp: 20 20 20 20   Temp: 98.3 F (36.8 C) 98.1 F (36.7 C) 98.1 F (36.7 C) 98.3 F (36.8 C)  TempSrc: Oral  Oral Oral Oral  SpO2: 98% 93% 98% 98%  Weight:      Height:        Intake/Output Summary (Last 24 hours) at 02/04/17 0815 Last data filed at 02/04/17 0604  Gross per 24 hour  Intake          2501.33 ml  Output             1200 ml  Net          1301.33 ml   Filed Weights   02/02/17 1758  Weight: 93 kg (205 lb)    Exam:  General exam: awake, alert, NAD, cooperative.  Respiratory system:  No increased work of breathing. No wheezing heard.  Chest Wall: no rash seen on chest wall to suggest herpes. Cardiovascular system: S1 & S2 heard, RRR. No JVD, murmurs, gallops, clicks or pedal edema. Gastrointestinal system: Abdomen is nondistended, soft and nontender. Normal bowel sounds heard. Central nervous system: Alert and oriented. No focal neurological deficits. Extremities: no CCE.  Data Reviewed: Basic Metabolic Panel:  Recent Labs Lab 02/02/17 1414 02/02/17 1442 02/03/17 0133 02/04/17 0546  NA 141 142 138 133*  K 4.3 4.2 3.6 4.3  CL 109 108 106 100*  CO2 24  --  24 24  GLUCOSE 95 97 211* 94  BUN 21* 22* 19 15  CREATININE 0.77 0.80 0.81 0.79  CALCIUM 9.1  --  8.6* 8.7*   Liver Function Tests:  Recent Labs Lab 02/03/17 0133 02/04/17 0546  AST 24 26  ALT 28 33  ALKPHOS 126 127*  BILITOT 0.2*  0.5  PROT 6.6 6.7  ALBUMIN 3.4* 3.3*   No results for input(s): LIPASE, AMYLASE in the last 168 hours. No results for input(s): AMMONIA in the last 168 hours. CBC:  Recent Labs Lab 02/02/17 1407 02/02/17 1442 02/03/17 0133 02/04/17 0546  WBC 8.6  --  8.9 8.0  HGB 13.6 12.2* 12.7* 13.2  HCT 38.9* 36.0* 38.0* 39.0  MCV 88.0  --  88.6 87.1  PLT 229  --  231 237   Cardiac Enzymes:  Recent Labs Lab 02/02/17 1414  TROPONINI <0.03   CBG (last 3)  No results for input(s): GLUCAP in the last 72 hours. Recent Results (from the past 240 hour(s))  MRSA PCR Screening     Status: None   Collection Time: 02/02/17  9:06 PM  Result Value Ref Range Status   MRSA by PCR  NEGATIVE NEGATIVE Final    Comment:        The GeneXpert MRSA Assay (FDA approved for NASAL specimens only), is one component of a comprehensive MRSA colonization surveillance program. It is not intended to diagnose MRSA infection nor to guide or monitor treatment for MRSA infections.      Studies: Dg Chest 2 View  Result Date: 02/02/2017 CLINICAL DATA:  56 year old male with chest pain and cough for 2 days. EXAM: CHEST  2 VIEW COMPARISON:  04/25/2009 chest CT FINDINGS: This is a low volume film. Streaky and ill-defined opacity within the left lower lobe may represent pneumonia versus atelectasis. A small right pleural effusion is noted. Cardiomediastinal silhouette is unchanged. Minimal right basilar atelectasis is noted. There is no evidence of pneumothorax or acute bony abnormality. IMPRESSION: Streaky and ill-defined left lower lobe opacity which may represent pneumonia versus atelectasis. Small right pleural effusion. Electronically Signed   By: Margarette Canada M.D.   On: 02/02/2017 13:23   Ct Angio Chest Pe W/cm &/or Wo Cm  Result Date: 02/03/2017 CLINICAL DATA:  Followup CTA in 24 hours may be helpful to confirm the right lower lobe emboli.Chest pain and SOB. EXAM: CT ANGIOGRAPHY CHEST WITH CONTRAST TECHNIQUE: Multidetector CT imaging of the chest was performed using the standard protocol during bolus administration of intravenous contrast. Multiplanar CT image reconstructions and MIPs were obtained to evaluate the vascular anatomy. CONTRAST:  100 mL of Isovue 370 intravenous contrast COMPARISON:  CTA, 02/02/2017 FINDINGS: Cardiovascular: Pulmonary emboli to the right lower lobe posterior basilar segment and lateral segment is unchanged from the prior CT. There is no other convincing evidence of pulmonary emboli. The pulmonary arteries were satisfactory opacified. Study was somewhat degraded by motion. Heart is normal in size. No coronary artery calcifications. Thoracic aorta is normal  caliber with no dissection. Mediastinum/Nodes: There are prominent shotty mediastinal lymph nodes. Largest measures 14 mm in short axis, precarinal. These are stable from the prior CT. No mediastinal or hilar masses. Lungs/Pleura: There is patchy opacity in the right lower lobe at the lung base. This may all be atelectasis. Pneumonia should be considered if there are consistent clinical symptoms. There is a small associated pleural effusion. The appearance is stable from the prior study. Mild subsegmental atelectasis is noted at the lateral left lung base and in the anterior lung bases. Remainder of the lungs is clear. No pneumothorax. No left pleural effusion. Upper Abdomen: Small gallstones.  No acute findings. Musculoskeletal: There are small areas of lucency within multiple vertebra, the largest in the right posterior aspect of T5. These are nonspecific. These are new when compared to a CT  of the chest dated 04/25/2009. Consider lytic metastatic disease or multiple myeloma in the differential. Review of the MIP images confirms the above findings. IMPRESSION: 1. Current exam confirms segmental pulmonary emboli to the right lower lobe associated with right lower lobe consolidation and a small effusion. Right lower lobe opacity may reflect combination of atelectasis and infarction. Pneumonia should be considered if there are consistent clinical symptoms. 2. Mild shotty mediastinal adenopathy is stable from the prior exam. 3. Multiple small lucent vertebral bone lesions. These are concerning for lytic metastatic disease or multiple myeloma. Electronically Signed   By: Lajean Manes M.D.   On: 02/03/2017 15:25   Ct Angio Chest Pe W/cm &/or Wo Cm  Result Date: 02/02/2017 CLINICAL DATA:  Persistent right rib and shoulder pain beginning 3 days ago after episode of choking while eating. Pain worse with deep inspiration, coughing and sneezing. Mild hemoptysis. EXAM: CT ANGIOGRAPHY CHEST WITH CONTRAST TECHNIQUE:  Multidetector CT imaging of the chest was performed using the standard protocol during bolus administration of intravenous contrast. Multiplanar CT image reconstructions and MIPs were obtained to evaluate the vascular anatomy. CONTRAST:  100 mL Isovue 370 IV. COMPARISON:  CT 04/25/2009 FINDINGS: Cardiovascular: Heart is normal in size. Thoracic aorta is within normal. Pulmonary arterial system is well opacified no evidence of left-sided pulmonary emboli. Findings suggest small emboli over the right lower lobe pulmonary artery lateral basilar segment. RV/LV ratio is 43.2/ 50.8 = 0.83. Mediastinum/Nodes: 1 cm right peritracheal lymph node likely reactive. Possible 1.1 cm precarinal and subcarinal lymph nodes likely reactive. Adenopathy. Remaining mediastinal structures are unremarkable. Lungs/Pleura: Lungs are adequately inflated with heterogeneous airspace opacification over the right lower lobe with small amount right pleural fluid. Minimal linear atelectasis over the right middle lobe and lingula. Airways are within normal. Upper Abdomen: Minimal cholelithiasis. Musculoskeletal: Unremarkable. Review of the MIP images confirms the above findings. IMPRESSION: Findings suggest small emboli over the right lower lobe pulmonary artery. Heterogeneous airspace opacification over the right lower lobe with small right pleural effusion. Findings may be due to atelectasis and infarction, although cannot exclude pneumonia. Mild mediastinal adenopathy likely reactive. Followup CTA in 24 hours may be helpful to confirm the right lower lobe emboli. Minimal cholelithiasis. Critical Value/emergent results were called by telephone at the time of interpretation on 02/02/2017 at 3:57 pm to Dr. Flonnie Overman , who verbally acknowledged these results. Electronically Signed   By: Marin Olp M.D.   On: 02/02/2017 15:47   Scheduled Meds: . amLODipine  5 mg Oral Daily  . docusate sodium  200 mg Oral BID  . ketorolac  30 mg  Intravenous Q6H  . sodium chloride flush  3 mL Intravenous Q12H   Continuous Infusions: . heparin 2,000 Units/hr (02/04/17 0604)    Active Problems:   Pulmonary emboli ([place])  Time spent: 34mns  Thaddeaus Monica, MD, PhD [facility] Pager [phone] If 7PM-7AM, please contact night-coverage [email or web address] Password [email or web address] 02/04/2017, 8:15 AM    LOS: 1 day

## 2017-02-04 NOTE — Consult Note (Signed)
[facility]  [phone]   [place]  DOB: Jan 23, 1961  MR#: [id number]  CSN#: [id number]    Requesting Physician: [facility] Dr. Erlinda Hong  Patient Care Team: Chrystie Nose as PCP - General (Physician Assistant)  Reason for consult: lytic bone lesions in thoracic spine, rule out MM   History of present illness:  56 year old Spanish-speaking man, without a significant past medical history, presented with right chest and shoulder pain for the past 4 days, was admitted for PE yesterday. CTA showed small emboli  In the right lower lobe segmental pulmonary artery. Scan also showed multiple small lucent thoracic vertebral bone lesions, I was consulted for concern of multiple myeloma.  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 He denies any recent pain or other symptoms, no change of his appetite and energy level lately.  MEDICAL HISTORY:  Past Medical History:  Diagnosis Date  . Hypertension     SURGICAL HISTORY: Past Surgical History:  Procedure Laterality Date  . INCISION AND DRAINAGE ABSCESS N/A 10/30/2013   Procedure: INCISION AND DRAINAGE ABSCESS LEFT INNER GROIN;  Surgeon: Pedro Earls, MD;  Location: [facility];  Service: General;  Laterality: N/A;  . Nodules removed from throat  1970's    SOCIAL HISTORY: Social History   Social History  . Marital status: Married    Spouse name: N/A  . Number of children: N/A  . Years of education: N/A   Occupational History  . Not on file.   Social History Main Topics  . Smoking status: Current Every Day Smoker    Packs/day: 0.25    Types: Cigarettes  . Smokeless tobacco: Never Used  . Alcohol use 2.4 oz/week    2 Cans of beer, 2 Shots of liquor per week  . Drug use: No  . Sexual activity: Not Currently   Other Topics Concern  . Not on file   Social History Narrative  . No narrative on file    FAMILY  HISTORY: Family History  Problem Relation Age of Onset  . Hypertension Mother   . Heart failure Father     ALLERGIES:  has No Known Allergies.  MEDICATIONS:  Current Facility-Administered Medications  Medication Dose Route Frequency Provider Last Rate Last Dose  . amLODipine (NORVASC) tablet 5 mg  5 mg Oral Daily Johnson, Clanford L, MD   5 mg at 02/04/17 0945  . docusate sodium (COLACE) capsule 200 mg  200 mg Oral BID Johnson, Clanford L, MD   200 mg at 02/04/17 0945  . heparin ADULT infusion 100 units/mL (25000 units/222m sodium chloride 0.45%)  2,000 Units/hr Intravenous Continuous GDorrene German RPH 20 mL/hr at 02/04/17 0604 2,000 Units/hr at 02/04/17 0604  . hydrALAZINE (APRESOLINE) injection 10 mg  10 mg Intravenous Q6H PRN XFlorencia Reasons MD      . morphine 4 MG/ML injection 1 mg  1 mg Intravenous Q3H PRN JWynetta Emery Clanford L, MD   1 mg at 02/03/17 2104  . nicotine (NICODERM CQ - dosed in mg/24 hours) patch 14 mg  14 mg Transdermal Daily PRN Johnson, Clanford L, MD      . oxyCODONE-acetaminophen (PERCOCET/ROXICET) 5-325 MG per tablet 1-2 tablet  1-2 tablet Oral Q6H PRN JWynetta Emery Clanford L, MD   2 tablet at 02/04/17 1518  . sodium chloride flush (NS) 0.9 % injection 3 mL  3 mL Intravenous Q12H XFlorencia Reasons MD   3 mL at 02/03/17 2215  REVIEW OF SYSTEMS:   Constitutional: Denies fevers, chills or abnormal night sweats Eyes: Denies blurriness of vision, double vision or watery eyes Ears, nose, mouth, throat, and face: Denies mucositis or sore throat Respiratory: Denies cough, dyspnea or wheezes Cardiovascular: Denies palpitation, chest discomfort or lower extremity swelling Gastrointestinal:  Denies nausea, heartburn or change in bowel habits Skin: Denies abnormal skin rashes Lymphatics: Denies new lymphadenopathy or easy bruising Neurological:Denies numbness, tingling or new weaknesses Behavioral/Psych: Mood is stable, no new changes  All other systems were reviewed with the  patient and are negative.  PHYSICAL EXAMINATION: ECOG PERFORMANCE STATUS: 2 - Symptomatic, <50% confined to bed  Vitals:   02/03/17 2142 02/04/17 0542  BP: (!) 166/90 (!) 148/83  Pulse: 63 66  Resp: 20 20  Temp: 98.1 F (36.7 C) 98.3 F (36.8 C)   Filed Weights   02/02/17 1758  Weight: 205 lb (93 kg)    GENERAL:alert, no distress and comfortable SKIN: skin color, texture, turgor are normal, no rashes or significant lesions EYES: normal, conjunctiva are pink and non-injected, sclera clear OROPHARYNX:no exudate, no erythema and lips, buccal mucosa, and tongue normal  NECK: supple, thyroid normal size, non-tender, without nodularity LYMPH:  no palpable lymphadenopathy in the cervical, axillary or inguinal LUNGS: clear to auscultation and percussion with normal breathing effort HEART: regular rate & rhythm and no murmurs and no lower extremity edema ABDOMEN:abdomen soft, non-tender and normal bowel sounds Musculoskeletal:no cyanosis of digits and no clubbing  PSYCH: alert & oriented x 3 with fluent speech NEURO: no focal motor/sensory deficits  LABORATORY DATA:  I have reviewed the data as listed Lab Results  Component Value Date   WBC 8.0 02/04/2017   HGB 13.2 02/04/2017   HCT 39.0 02/04/2017   MCV 87.1 02/04/2017   PLT 237 02/04/2017    Recent Labs  02/02/17 1414 02/02/17 1442 02/03/17 0133 02/04/17 0546  NA 141 142 138 133*  K 4.3 4.2 3.6 4.3  CL 109 108 106 100*  CO2 24  --  24 24  GLUCOSE 95 97 211* 94  BUN 21* 22* 19 15  CREATININE 0.77 0.80 0.81 0.79  CALCIUM 9.1  --  8.6* 8.7*  GFRNONAA >60  --  >60 >60  GFRAA >60  --  >60 >60  PROT  --   --  6.6 6.7  ALBUMIN  --   --  3.4* 3.3*  AST  --   --  24 26  ALT  --   --  28 33  ALKPHOS  --   --  126 127*  BILITOT  --   --  0.2* 0.5    RADIOGRAPHIC STUDIES: I have personally reviewed the radiological images as listed and agreed with the findings in the report. Dg Chest 2 View  Result Date:  02/02/2017 CLINICAL DATA:  56 year old male with chest pain and cough for 2 days. EXAM: CHEST  2 VIEW COMPARISON:  04/25/2009 chest CT FINDINGS: This is a low volume film. Streaky and ill-defined opacity within the left lower lobe may represent pneumonia versus atelectasis. A small right pleural effusion is noted. Cardiomediastinal silhouette is unchanged. Minimal right basilar atelectasis is noted. There is no evidence of pneumothorax or acute bony abnormality. IMPRESSION: Streaky and ill-defined left lower lobe opacity which may represent pneumonia versus atelectasis. Small right pleural effusion. Electronically Signed   By: Margarette Canada M.D.   On: 02/02/2017 13:23   Ct Angio Chest Pe W/cm &/or Wo Cm  Result Date: 02/03/2017  CLINICAL DATA:  Followup CTA in 24 hours may be helpful to confirm the right lower lobe emboli.Chest pain and SOB. EXAM: CT ANGIOGRAPHY CHEST WITH CONTRAST TECHNIQUE: Multidetector CT imaging of the chest was performed using the standard protocol during bolus administration of intravenous contrast. Multiplanar CT image reconstructions and MIPs were obtained to evaluate the vascular anatomy. CONTRAST:  100 mL of Isovue 370 intravenous contrast COMPARISON:  CTA, 02/02/2017 FINDINGS: Cardiovascular: Pulmonary emboli to the right lower lobe posterior basilar segment and lateral segment is unchanged from the prior CT. There is no other convincing evidence of pulmonary emboli. The pulmonary arteries were satisfactory opacified. Study was somewhat degraded by motion. Heart is normal in size. No coronary artery calcifications. Thoracic aorta is normal caliber with no dissection. Mediastinum/Nodes: There are prominent shotty mediastinal lymph nodes. Largest measures 14 mm in short axis, precarinal. These are stable from the prior CT. No mediastinal or hilar masses. Lungs/Pleura: There is patchy opacity in the right lower lobe at the lung base. This may all be atelectasis. Pneumonia should be  considered if there are consistent clinical symptoms. There is a small associated pleural effusion. The appearance is stable from the prior study. Mild subsegmental atelectasis is noted at the lateral left lung base and in the anterior lung bases. Remainder of the lungs is clear. No pneumothorax. No left pleural effusion. Upper Abdomen: Small gallstones.  No acute findings. Musculoskeletal: There are small areas of lucency within multiple vertebra, the largest in the right posterior aspect of T5. These are nonspecific. These are new when compared to a CT of the chest dated 04/25/2009. Consider lytic metastatic disease or multiple myeloma in the differential. Review of the MIP images confirms the above findings. IMPRESSION: 1. Current exam confirms segmental pulmonary emboli to the right lower lobe associated with right lower lobe consolidation and a small effusion. Right lower lobe opacity may reflect combination of atelectasis and infarction. Pneumonia should be considered if there are consistent clinical symptoms. 2. Mild shotty mediastinal adenopathy is stable from the prior exam. 3. Multiple small lucent vertebral bone lesions. These are concerning for lytic metastatic disease or multiple myeloma. Electronically Signed   By: Lajean Manes M.D.   On: 02/03/2017 15:25   Ct Angio Chest Pe W/cm &/or Wo Cm  Result Date: 02/02/2017 CLINICAL DATA:  Persistent right rib and shoulder pain beginning 3 days ago after episode of choking while eating. Pain worse with deep inspiration, coughing and sneezing. Mild hemoptysis. EXAM: CT ANGIOGRAPHY CHEST WITH CONTRAST TECHNIQUE: Multidetector CT imaging of the chest was performed using the standard protocol during bolus administration of intravenous contrast. Multiplanar CT image reconstructions and MIPs were obtained to evaluate the vascular anatomy. CONTRAST:  100 mL Isovue 370 IV. COMPARISON:  CT 04/25/2009 FINDINGS: Cardiovascular: Heart is normal in size. Thoracic aorta  is within normal. Pulmonary arterial system is well opacified no evidence of left-sided pulmonary emboli. Findings suggest small emboli over the right lower lobe pulmonary artery lateral basilar segment. RV/LV ratio is 43.2/ 50.8 = 0.83. Mediastinum/Nodes: 1 cm right peritracheal lymph node likely reactive. Possible 1.1 cm precarinal and subcarinal lymph nodes likely reactive. Adenopathy. Remaining mediastinal structures are unremarkable. Lungs/Pleura: Lungs are adequately inflated with heterogeneous airspace opacification over the right lower lobe with small amount right pleural fluid. Minimal linear atelectasis over the right middle lobe and lingula. Airways are within normal. Upper Abdomen: Minimal cholelithiasis. Musculoskeletal: Unremarkable. Review of the MIP images confirms the above findings. IMPRESSION: Findings suggest small emboli over the  right lower lobe pulmonary artery. Heterogeneous airspace opacification over the right lower lobe with small right pleural effusion. Findings may be due to atelectasis and infarction, although cannot exclude pneumonia. Mild mediastinal adenopathy likely reactive. Followup CTA in 24 hours may be helpful to confirm the right lower lobe emboli. Minimal cholelithiasis. Critical Value/emergent results were called by telephone at the time of interpretation on 02/02/2017 at 3:57 pm to Dr. Flonnie Overman , who verbally acknowledged these results. Electronically Signed   By: Marin Olp M.D.   On: 02/02/2017 15:47    ASSESSMENT & PLAN:  56 year old Spanish-speaking male, without significant past medical history, presented with white lower lobe segmental pulmonary emboli, unprovoked. CT chest also showed multiple lucent thoracic spine vertebral lesions, concerning for multiple myeloma.  1. Thoracic spinal vertebral  Lytic bone lesions, rule out MM and metastatic cancer  2. Right LLL sgemental PE, on heparin   Recommendations: -I have reviewed his CT scan images.  Although his bone lesions are very small and not impressive, given his unprovoked PE, malignancy needs to be ruled out. -Dr. Erlinda Hong has ordered SPEP and light chain levels, results are pending. I will add IFE (spoke with lab to add on to SPEP), and 24 hour urine UPEP/IFE and urine light chain levels  -I will order bone survey -I will call IR tomorrow morning to see if they can get bone marrow biopsy done  -please consider to get a CT abd/pel with contrast to rule out malignancy  -I discussed the option of coumadin and Xarelto for outpt treatment of his unprovoked PE. I will consult out oral pharmacist tomorrow to see if we can get him free Xarelto. If not, he can start coumadin after bone marrow biopsy  -will order NPO after midnight today  -I will follow, and set up his outpt f/u on discharge   All questions were answered. The patient knows to call the clinic with any problems, questions or concerns.      Truitt Merle, MD 02/04/2017 4:13 PM

## 2017-02-05 DIAGNOSIS — C9000 Multiple myeloma not having achieved remission: Secondary | ICD-10-CM

## 2017-02-05 LAB — BASIC METABOLIC PANEL
ANION GAP: 9 (ref 5–15)
BUN: 14 mg/dL (ref 6–20)
CALCIUM: 8.9 mg/dL (ref 8.9–10.3)
CO2: 23 mmol/L (ref 22–32)
Chloride: 104 mmol/L (ref 101–111)
Creatinine, Ser: 0.83 mg/dL (ref 0.61–1.24)
GFR calc non Af Amer: >60 mL/min (ref >60)
Glucose, Bld: 116 mg/dL — ABNORMAL HIGH (ref 65–99)
Potassium: 4.3 mmol/L (ref 3.5–5.1)
Sodium: 136 mmol/L (ref 135–145)

## 2017-02-05 LAB — CBC
HEMATOCRIT: 38.1 % — AB (ref 39.0–52.0)
HEMOGLOBIN: 13.3 g/dL (ref 13.0–17.0)
MCH: 30.2 pg (ref 26.0–34.0)
MCHC: 34.9 g/dL (ref 30.0–36.0)
MCV: 86.6 fL (ref 78.0–100.0)
Platelets: 240 10*3/uL (ref 150–400)
RBC: 4.4 MIL/uL (ref 4.22–5.81)
RDW: 13.1 % (ref 11.5–15.5)
WBC: 7.9 10*3/uL (ref 4.0–10.5)

## 2017-02-05 LAB — BETA 2 MICROGLOBULIN, SERUM: Beta-2 Microglobulin: 3.5 mg/L — ABNORMAL HIGH (ref 0.6–2.4)

## 2017-02-05 LAB — HEPARIN LEVEL (UNFRACTIONATED): HEPARIN UNFRACTIONATED: 0.3 [IU]/mL (ref 0.30–0.70)

## 2017-02-05 LAB — KAPPA/LAMBDA LIGHT CHAINS
KAPPA FREE LGHT CHN: 17.1 mg/L (ref 3.3–19.4)
Kappa, lambda light chain ratio: 0.72 (ref 0.26–1.65)
LAMDA FREE LIGHT CHAINS: 23.9 mg/L (ref 5.7–26.3)

## 2017-02-05 LAB — CEA: CEA: 0.8 ng/mL (ref 0.0–4.7)

## 2017-02-05 LAB — MAGNESIUM: MAGNESIUM: 1.9 mg/dL (ref 1.7–2.4)

## 2017-02-05 LAB — PROTEIN, URINE, 24 HOUR
COLLECTION INTERVAL-UPROT: 24 h
Protein, Urine: <6 mg/dL
Urine Total Volume-UPROT: 3500 mL

## 2017-02-05 MED ORDER — RIVAROXABAN (XARELTO) VTE STARTER PACK (15 & 20 MG): ORAL_TABLET | ORAL | 0 refills | Status: DC

## 2017-02-05 MED ORDER — RIVAROXABAN 20 MG PO TABS: 20.0000 mg | ORAL_TABLET | Freq: Every day | ORAL | Status: DC

## 2017-02-05 MED ORDER — IBUPROFEN 800 MG PO TABS: 800.0000 mg | ORAL_TABLET | Freq: Three times a day (TID) | ORAL | 0 refills | Status: DC | PRN

## 2017-02-05 MED ORDER — RIVAROXABAN 15 MG PO TABS
15.0000 mg | ORAL_TABLET | Freq: Two times a day (BID) | ORAL | Status: DC
  Administered 2017-02-05: 15 mg via ORAL
  Filled 2017-02-05: qty 1

## 2017-02-05 MED ORDER — AMLODIPINE BESYLATE 5 MG PO TABS: 5.0000 mg | ORAL_TABLET | Freq: Every day | ORAL | 0 refills | Status: DC

## 2017-02-05 MED ORDER — HYDROCODONE-ACETAMINOPHEN 5-325 MG PO TABS: 1.0000 | ORAL_TABLET | Freq: Four times a day (QID) | ORAL | 0 refills | Status: DC | PRN

## 2017-02-05 MED ORDER — HEPARIN (PORCINE) IN NACL 100-0.45 UNIT/ML-% IJ SOLN: 2100.0000 [IU]/h | INTRAMUSCULAR | Status: AC

## 2017-02-05 MED FILL — ?AMLODIPINE BESYLATE 5 MG T: 5 | 30 days supply | Qty: 30 | Fill #0

## 2017-02-05 NOTE — Discharge Summary (Addendum)
Discharge Summary  Guy Walker UMP:[id number] DOB: 1961/02/11  PCP: Cathleen Corti, PA-C  Admit date: 02/02/2017 Discharge date: 02/05/2017  Time spent: >10mns, more than 50% time spent on coordination of care.  Recommendations for Outpatient Follow-up:  1. F/u with PMD within a week  for hospital discharge follow up, repeat cbc/bmp at follow up 2. F/u with oncology Dr FBurr Medicofor PE management and cancer work up ( bone marrow biopsy) 3. F/u with interventional radiology for bone biopsy  Discharge Diagnoses:  Active Hospital Problems   Diagnosis Date Noted  . Bone lesion   . [place] 02[place]    Resolved Hospital Problems   Diagnosis Date Noted Date Resolved  No resolved problems to display.    Discharge Condition: stable  Diet recommendation: heart healthy  Filed Weights   02/02/17 1758  Weight: 93 kg (205 lb)    History of present illness:  PCP: LCathleen Corti PA-C   Chief Complaint: right sided chest pain/shoulder pain, hemoptysis  HPI: Guy Walker a 56y.o. male   With no significant past medical history [facility]. Pt reports persistent right sided hest wall pain, and right sided shoulder pain x 2 days. Seen at clinic yesterday. Rx for muscle relaxant, antibiotic and anti-inflammatory. Pt reports coughing up threads of blood since yesterday. Pt is alert oriented and ambulatory.   ED course: blood pressure elevated, sbp around 150's, no hypoxia , no tachycardia, no fever.  Tele with N[facility], Basic labs unremarkable,  CTA"  Findings suggest small emboli over the right lower lobe pulmonary artery. Heterogeneous airspace opacification over the right lower lobe with small right pleural effusion. Findings may be due to atelectasis and infarction, although cannot exclude pneumonia. Mild mediastinal adenopathy likely reactive. Followup CTA in 24 hours may be helpful to confirm the right lower  lobe emboli."  Patient is started on heparin drip, hospitalist called to observe and treat the patient.  Patient otherwise no fever, no edema, no n/v. He denies recent trauma. He is active at baseline. Denies recent h/o travel, no family history of clotting disorders. He does smoke but have been cutting down.    Hospital Course:  Active Problems:   Pulmonary emboli (HCC)   Bone lesion   Acute Pulmonary emboli  --Patent presented with significant pleuritic right sided chest pain,  right shoulder pain ,and mild hemoptysis, no tachycardia, no hypoxia, no hypotension.  --Echocardiogram " Normal LV systolic function; mild LVH; mild diastolic dysfunction; mild TR with mildly elevated pulmonary pressure.  --venous doppler no DVT. --repeat cta in 24hrs per radiology recommendation  Confirmed PE. --he received heparin drip, pain is improving, he is transitioned to oral xarelto, hematology/oncology consulted, he is to continue follow with oncology Dr FBurr Medico   lytic bone lesions:  --CT chest showed "Multiple small lucent vertebral bone lesions. These are concerning for lytic metastatic disease or multiple myeloma. --CT ab/pel: "1. Scattered lucencies along the lower thoracic and lumbar spine may reflect multiple myeloma or possibly metastatic disease. Chronic mild loss of height involving the superior endplate of TQ00 --METASTATIC BONE SURVEY: Single lucency within the parietal bone on the lateral film of the skull. No other lucencies are identified. --Spep/upep pending, cea/psa wnl, ldh elevated  --oncology Dr FBurr Medicoconsulted, due to IR scheduling , patient is not able get bone marrow biopsy while in the hospital . He is discharged home and return for biopsy  Interventional radiology and  oncology Dr FBurr Medicowill  continue to follow patient.   Elevated blood pressure: no prior diagnosis of HTN Likely exacerbated by uncontrolled pain.  See above. Started amlodipine 5 mg daily      Cholelithiasis.  Gallbladder otherwise unremarkable on CT ab/pel, no abdominal pain, no n/v. lft unremarkable.   Tobacco Abuse - counseled on tobacco cessation.    DVT prophylaxis in the hospital: on heparin drip  Consultants:oncology/interventional radiology  Code Status:full   Family Communication:Daughter bedside  Disposition Plan:  home    Discharge Exam: BP (!) 147/85 (BP Location: Left Arm)   Pulse 66   Temp 98.7 F (37.1 C) (Oral)   Resp 20   Ht 5' 9"  (1.753 m)   Wt 93 kg (205 lb)   SpO2 96%   BMI 30.27 kg/m   General: NAD Cardiovascular: RRR Respiratory: CTABL  Discharge Instructions You were cared for by a hospitalist during your hospital stay. If you have any questions about your discharge medications or the care you received while you were in the hospital after you are discharged, you can call the unit and asked to speak with the hospitalist on call if the hospitalist that took care of you is not available. Once you are discharged, your primary care physician will handle any further medical issues. Please note that NO REFILLS for any discharge medications will be authorized once you are discharged, as it is imperative that you return to your primary care physician (or establish a relationship with a primary care physician if you do not have one) for your aftercare needs so that they can reassess your need for medications and monitor your lab values.  Discharge Instructions    Diet - low sodium heart healthy    Complete by:  As directed    Increase activity slowly    Complete by:  As directed      Allergies as of 02/05/2017   No Known Allergies     Medication List    STOP taking these medications   azithromycin 500 MG tablet Commonly known as:  ZITHROMAX   cyclobenzaprine 10 MG tablet Commonly known as:  FLEXERIL   diclofenac 75 MG EC tablet Commonly known as:  VOLTAREN   methocarbamol 500 MG tablet Commonly known as:  ROBAXIN      TAKE these medications   acetaminophen 325 MG tablet Commonly known as:  TYLENOL Take 650 mg by mouth every 6 (six) hours as needed.   amLODipine 5 MG tablet Commonly known as:  NORVASC Take 1 tablet (5 mg total) by mouth daily. Start taking on:  02/06/2017   HYDROcodone-acetaminophen 5-325 MG tablet Commonly known as:  NORCO Take 1 tablet by mouth every 6 (six) hours as needed for moderate pain.   ibuprofen 800 MG tablet Commonly known as:  ADVIL,MOTRIN Take 1 tablet (800 mg total) by mouth every 8 (eight) hours as needed. What changed:  when to take this  reasons to take this   Rivaroxaban 15 & 20 MG Tbpk Take as directed on package: Start with one 53m tablet by mouth twice a day with food. On Day 22, switch to one 276mtablet once a day with food.      No Known Allergies Follow-up Information    FeTruitt MerleMD Follow up.   Specialties:  Hematology, Oncology Contact information: [phone]      LoBrantley Stage, PA-C Follow up.   Specialty:  Physician Assistant Contact information: [address]  [place] [address] [phone]        follow up with interventional radiology for bone biopsy Follow up.        [facility] Follow up on 02/09/2017.   Specialty:  Internal Medicine Why:  appointment Friday 25, 2018 at 10:30 AM. Please keep appointment. Contact information: [place] [phone]           The results of significant diagnostics from this hospitalization (including imaging, microbiology, ancillary and laboratory) are listed below for reference.    Significant Diagnostic Studies: Dg Chest 2 View  Result Date: 02/02/2017 CLINICAL DATA:  56 year old male with chest pain and cough for 2 days. EXAM: CHEST  2 VIEW COMPARISON:  04/25/2009 chest CT FINDINGS: This is a low volume film. Streaky and ill-defined opacity within the left  lower lobe may represent pneumonia versus atelectasis. A small right pleural effusion is noted. Cardiomediastinal silhouette is unchanged. Minimal right basilar atelectasis is noted. There is no evidence of pneumothorax or acute bony abnormality. IMPRESSION: Streaky and ill-defined left lower lobe opacity which may represent pneumonia versus atelectasis. Small right pleural effusion. Electronically Signed   By: Margarette Canada M.D.   On: 02/02/2017 13:23   Ct Angio Chest Pe W/cm &/or Wo Cm  Result Date: 02/03/2017 CLINICAL DATA:  Followup CTA in 24 hours may be helpful to confirm the right lower lobe emboli.Chest pain and SOB. EXAM: CT ANGIOGRAPHY CHEST WITH CONTRAST TECHNIQUE: Multidetector CT imaging of the chest was performed using the standard protocol during bolus administration of intravenous contrast. Multiplanar CT image reconstructions and MIPs were obtained to evaluate the vascular anatomy. CONTRAST:  100 mL of Isovue 370 intravenous contrast COMPARISON:  CTA, 02/02/2017 FINDINGS: Cardiovascular: Pulmonary emboli to the right lower lobe posterior basilar segment and lateral segment is unchanged from the prior CT. There is no other convincing evidence of pulmonary emboli. The pulmonary arteries were satisfactory opacified. Study was somewhat degraded by motion. Heart is normal in size. No coronary artery calcifications. Thoracic aorta is normal caliber with no dissection. Mediastinum/Nodes: There are prominent shotty mediastinal lymph nodes. Largest measures 14 mm in short axis, precarinal. These are stable from the prior CT. No mediastinal or hilar masses. Lungs/Pleura: There is patchy opacity in the right lower lobe at the lung base. This may all be atelectasis. Pneumonia should be considered if there are consistent clinical symptoms. There is a small associated pleural effusion. The appearance is stable from the prior study. Mild subsegmental atelectasis is noted at the lateral left lung base and in the  anterior lung bases. Remainder of the lungs is clear. No pneumothorax. No left pleural effusion. Upper Abdomen: Small gallstones.  No acute findings. Musculoskeletal: There are small areas of lucency within multiple vertebra, the largest in the right posterior aspect of T5. These are nonspecific. These are new when compared to a CT of the chest dated 04/25/2009. Consider lytic metastatic disease or multiple myeloma in the differential. Review of the MIP images confirms the above findings. IMPRESSION: 1. Current exam confirms segmental pulmonary emboli to the right lower lobe associated with right lower lobe consolidation and a small effusion. Right lower lobe opacity may reflect combination of atelectasis and infarction. Pneumonia should be considered if there are consistent clinical symptoms. 2. Mild shotty mediastinal adenopathy is stable from the prior exam. 3. Multiple small lucent vertebral bone lesions. These are concerning for lytic metastatic disease or multiple myeloma. Electronically Signed   By:  Lajean Manes M.D.   On: 02/03/2017 15:25   Ct Angio Chest Pe W/cm &/or Wo Cm  Result Date: 02/02/2017 CLINICAL DATA:  Persistent right rib and shoulder pain beginning 3 days ago after episode of choking while eating. Pain worse with deep inspiration, coughing and sneezing. Mild hemoptysis. EXAM: CT ANGIOGRAPHY CHEST WITH CONTRAST TECHNIQUE: Multidetector CT imaging of the chest was performed using the standard protocol during bolus administration of intravenous contrast. Multiplanar CT image reconstructions and MIPs were obtained to evaluate the vascular anatomy. CONTRAST:  100 mL Isovue 370 IV. COMPARISON:  CT 04/25/2009 FINDINGS: Cardiovascular: Heart is normal in size. Thoracic aorta is within normal. Pulmonary arterial system is well opacified no evidence of left-sided pulmonary emboli. Findings suggest small emboli over the right lower lobe pulmonary artery lateral basilar segment. RV/LV ratio is 43.2/  50.8 = 0.83. Mediastinum/Nodes: 1 cm right peritracheal lymph node likely reactive. Possible 1.1 cm precarinal and subcarinal lymph nodes likely reactive. Adenopathy. Remaining mediastinal structures are unremarkable. Lungs/Pleura: Lungs are adequately inflated with heterogeneous airspace opacification over the right lower lobe with small amount right pleural fluid. Minimal linear atelectasis over the right middle lobe and lingula. Airways are within normal. Upper Abdomen: Minimal cholelithiasis. Musculoskeletal: Unremarkable. Review of the MIP images confirms the above findings. IMPRESSION: Findings suggest small emboli over the right lower lobe pulmonary artery. Heterogeneous airspace opacification over the right lower lobe with small right pleural effusion. Findings may be due to atelectasis and infarction, although cannot exclude pneumonia. Mild mediastinal adenopathy likely reactive. Followup CTA in 24 hours may be helpful to confirm the right lower lobe emboli. Minimal cholelithiasis. Critical Value/emergent results were called by telephone at the time of interpretation on 02/02/2017 at 3:57 pm to Dr. Flonnie Overman , who verbally acknowledged these results. Electronically Signed   By: Marin Olp M.D.   On: 02/02/2017 15:47   Ct Abdomen Pelvis W Contrast  Result Date: 02/04/2017 CLINICAL DATA:  Acute onset of right chest and shoulder pain. Recently diagnosed with pulmonary emboli, and question of multiple myeloma. Initial encounter. EXAM: CT ABDOMEN AND PELVIS WITH CONTRAST TECHNIQUE: Multidetector CT imaging of the abdomen and pelvis was performed using the standard protocol following bolus administration of intravenous contrast. CONTRAST:  187m ISOVUE-300 IOPAMIDOL (ISOVUE-300) INJECTION 61% COMPARISON:  CTA of the chest performed 02/03/2017 FINDINGS: Lower chest: A trace right-sided pleural effusion is noted. Right basilar airspace opacity reflects the patient's previously diagnosed right-sided  pulmonary emboli. Hepatobiliary: The liver is unremarkable in appearance. Stones are noted dependently within the gallbladder. The gallbladder is otherwise unremarkable. The common bile duct remains normal in caliber. Pancreas: The pancreas is within normal limits. Spleen: The spleen is unremarkable in appearance. Adrenals/Urinary Tract: The adrenal glands are unremarkable in appearance. A small right renal cyst is noted. There is no evidence of hydronephrosis. No renal or ureteral stones are identified. No perinephric stranding is seen. Stomach/Bowel: The stomach is unremarkable in appearance. The small bowel is within normal limits. The appendix is normal in caliber, without evidence of appendicitis. The colon is unremarkable in appearance. Vascular/Lymphatic: The abdominal aorta is unremarkable in appearance. The inferior vena cava is grossly unremarkable. No retroperitoneal lymphadenopathy is seen. No pelvic sidewall lymphadenopathy is identified. Reproductive: The bladder is mildly distended and grossly unremarkable. The prostate remains normal in size. Other: No additional soft tissue abnormalities are seen. Musculoskeletal: Scattered lucencies along the lower thoracic and lumbar spine may reflect multiple myeloma or possibly metastatic disease. There is chronic mild loss of  height involving the superior endplate of Y40. Vacuum phenomenon is noted at L4-L5, with associated sclerosis. The visualized musculature is unremarkable in appearance. IMPRESSION: 1. Scattered lucencies along the lower thoracic and lumbar spine may reflect multiple myeloma or possibly metastatic disease. Chronic mild loss of height involving the superior endplate of H47. 2. Small right renal cyst. 3. Cholelithiasis.  Gallbladder otherwise unremarkable. 4. Trace right-sided pleural effusion. Right basilar airspace opacity reflects the previously diagnosed right-sided pulmonary emboli. Electronically Signed   By: Garald Balding M.D.   On:  02/04/2017 18:38   Dg Bone Survey Met  Result Date: 02/04/2017 CLINICAL DATA:  Known lucencies of the thoracic spine EXAM: METASTATIC BONE SURVEY COMPARISON:  02/03/2017 FINDINGS: Metastatic bone survey was performed. Skull: Small lucency is noted in the parietal bone on the lateral film of the skull. Cervical spine: Degenerative changes without acute abnormality. Bilateral shoulders: Degenerative changes of the right acromioclavicular joint is noted. No definite lucencies are seen. Bilateral humeri and forearms:  No active lesions are noted. Chest: The lesions are seen. Bibasilar changes are noted right greater than left similar to that seen recent CT examination. Thoracic spine: Pedicles are within normal limits. Vertebral body height is well maintained. The lucency seen on recent CT are not well appreciated on this exam. Lumbar spine: Degenerative changes are noted phase vertebral body height is well maintained. No lucencies are identified. Bilateral femurs and tib-fib:  No definitive lucencies are noted. Pelvis:  No definitive lytic lesions noted. IMPRESSION: Single lucency within the parietal bone on the lateral film of the skull. No other lucencies are identified. Electronically Signed   By: Inez Catalina M.D.   On: 02/04/2017 18:24    Microbiology: Recent Results (from the past 240 hour(s))  MRSA PCR Screening     Status: None   Collection Time: 02/02/17  9:06 PM  Result Value Ref Range Status   MRSA by PCR NEGATIVE NEGATIVE Final    Comment:        The GeneXpert MRSA Assay (FDA approved for NASAL specimens only), is one component of a comprehensive MRSA colonization surveillance program. It is not intended to diagnose MRSA infection nor to guide or monitor treatment for MRSA infections.      Labs: Basic Metabolic Panel:  Recent Labs Lab 02/02/17 1414 02/02/17 1442 02/03/17 0133 02/04/17 0546 02/05/17 0508  NA 141 142 138 133* 136  K 4.3 4.2 3.6 4.3 4.3  CL 109 108 106  100* 104  CO2 24  --  24 24 23   GLUCOSE 95 97 211* 94 116*  BUN 21* 22* 19 15 14   CREATININE 0.77 0.80 0.81 0.79 0.83  CALCIUM 9.1  --  8.6* 8.7* 8.9  MG  --   --   --   --  1.9   Liver Function Tests:  Recent Labs Lab 02/03/17 0133 02/04/17 0546  AST 24 26  ALT 28 33  ALKPHOS 126 127*  BILITOT 0.2* 0.5  PROT 6.6 6.7  ALBUMIN 3.4* 3.3*   No results for input(s): LIPASE, AMYLASE in the last 168 hours. No results for input(s): AMMONIA in the last 168 hours. CBC:  Recent Labs Lab 02/02/17 1407 02/02/17 1442 02/03/17 0133 02/04/17 0546 02/05/17 0508  WBC 8.6  --  8.9 8.0 7.9  HGB 13.6 12.2* 12.7* 13.2 13.3  HCT 38.9* 36.0* 38.0* 39.0 38.1*  MCV 88.0  --  88.6 87.1 86.6  PLT 229  --  231 237 240   Cardiac Enzymes:  Recent Labs Lab 02/02/17 1414  TROPONINI <0.03   BNP: BNP (last 3 results)  Recent Labs  02/02/17 1414  BNP 36.0    ProBNP (last 3 results) No results for input(s): PROBNP in the last 8760 hours.  CBG: No results for input(s): GLUCAP in the last 168 hours.     SignedFlorencia Reasons MD, PhD  [facility] 02/05/2017, 4:09 PM

## 2017-02-05 NOTE — Progress Notes (Signed)
I spoke with IR, they are not available to do bone marrow biopsy today, likely tomorrow. Biopsy order is in.  OK to eat today.  Truitt Merle  02/05/2017

## 2017-02-05 NOTE — Progress Notes (Signed)
Guy Walker   DOB:[birth year]   PQ#:[id number]   FRT#:[id number]  Oncology follow up   Subjective: Pt still has right shoulder and reticulocyte chest pain, overall better, responded to IV pain medication. We'll probably go home today, since bone marrow biopsy cannot be done today or tomorrow.   Objective:  Vitals:   02/05/17 0432 02/05/17 1300  BP: (!) 149/86 (!) 147/85  Pulse: (!) 56 66  Resp: 20   Temp: 97.7 F (36.5 C) 98.7 F (37.1 C)    Body mass index is 30.27 kg/m.  Intake/Output Summary (Last 24 hours) at 02/05/17 1538 Last data filed at 02/05/17 1500  Gross per 24 hour  Intake            846.7 ml  Output             2175 ml  Net          -1328.3 ml     Sclerae unicteric  Oropharynx clear  No peripheral adenopathy  Lungs clear -- no rales or rhonchi  Heart regular rate and rhythm  Abdomen benign  MSK no focal spinal tenderness, no peripheral edema  Neuro nonfocal    CBG (last 3)  No results for input(s): GLUCAP in the last 72 hours.   Labs:  Lab Results  Component Value Date   WBC 7.9 02/05/2017   HGB 13.3 02/05/2017   HCT 38.1 (L) 02/05/2017   MCV 86.6 02/05/2017   PLT 240 02/05/2017   NEUTROABS 4.2 12/17/2015   CMP Latest Ref Rng & Units 02/05/2017 02/04/2017 02/03/2017  Glucose 65 - 99 mg/dL 116(H) 94 211(H)  BUN 6 - 20 mg/dL 14 15 19   Creatinine 0.61 - 1.24 mg/dL 0.83 0.79 0.81  Sodium 135 - 145 mmol/L 136 133(L) 138  Potassium 3.5 - 5.1 mmol/L 4.3 4.3 3.6  Chloride 101 - 111 mmol/L 104 100(L) 106  CO2 22 - 32 mmol/L 23 24 24   Calcium 8.9 - 10.3 mg/dL 8.9 8.7(L) 8.6(L)  Total Protein 6.5 - 8.1 g/dL - 6.7 6.6  Total Bilirubin 0.3 - 1.2 mg/dL - 0.5 0.2(L)  Alkaline Phos 38 - 126 U/L - 127(H) 126  AST 15 - 41 U/L - 26 24  ALT 17 - 63 U/L - 33 28    Urine Studies No results for input(s): UHGB, CRYS in the last 72 hours.  Invalid input(s): UACOL, UAPR, USPG, UPH, UTP, UGL, UKET, UBIL, UNIT, UROB, [place], UEPI, UWBC, Duwayne Heck  [place], [place]  Basic Metabolic Panel:  Recent Labs Lab 02/02/17 1414 02/02/17 1442 02/03/17 0133 02/04/17 0546 02/05/17 0508  NA 141 142 138 133* 136  K 4.3 4.2 3.6 4.3 4.3  CL 109 108 106 100* 104  CO2 24  --  24 24 23   GLUCOSE 95 97 211* 94 116*  BUN 21* 22* 19 15 14   CREATININE 0.77 0.80 0.81 0.79 0.83  CALCIUM 9.1  --  8.6* 8.7* 8.9  MG  --   --   --   --  1.9   GFR Estimated Creatinine Clearance: 115.9 mL/min (by C-G formula based on SCr of 0.83 mg/dL). Liver Function Tests:  Recent Labs Lab 02/03/17 0133 02/04/17 0546  AST 24 26  ALT 28 33  ALKPHOS 126 127*  BILITOT 0.2* 0.5  PROT 6.6 6.7  ALBUMIN 3.4* 3.3*   No results for input(s): LIPASE, AMYLASE in the last 168 hours. No results for input(s): AMMONIA in the last 168 hours. Coagulation profile  Recent Labs  Lab 02/02/17 1730  INR 1.00    CBC:  Recent Labs Lab 02/02/17 1407 02/02/17 1442 02/03/17 0133 02/04/17 0546 02/05/17 0508  WBC 8.6  --  8.9 8.0 7.9  HGB 13.6 12.2* 12.7* 13.2 13.3  HCT 38.9* 36.0* 38.0* 39.0 38.1*  MCV 88.0  --  88.6 87.1 86.6  PLT 229  --  231 237 240   Cardiac Enzymes:  Recent Labs Lab 02/02/17 1414  TROPONINI <0.03   BNP: Invalid input(s): POCBNP CBG: No results for input(s): GLUCAP in the last 168 hours. D-Dimer No results for input(s): DDIMER in the last 72 hours. Hgb A1c No results for input(s): HGBA1C in the last 72 hours. Lipid Profile No results for input(s): CHOL, HDL, LDLCALC, TRIG, CHOLHDL, LDLDIRECT in the last 72 hours. Thyroid function studies No results for input(s): TSH, T4TOTAL, T3FREE, THYROIDAB in the last 72 hours.  Invalid input(s): FREET3 Anemia work up No results for input(s): VITAMINB12, FOLATE, FERRITIN, TIBC, IRON, RETICCTPCT in the last 72 hours. Microbiology Recent Results (from the past 240 hour(s))  MRSA PCR Screening     Status: None   Collection Time: 02/02/17  9:06 PM  Result Value Ref Range Status   MRSA by PCR  NEGATIVE NEGATIVE Final    Comment:        The GeneXpert MRSA Assay (FDA approved for NASAL specimens only), is one component of a comprehensive MRSA colonization surveillance program. It is not intended to diagnose MRSA infection nor to guide or monitor treatment for MRSA infections.       Studies:  Ct Abdomen Pelvis W Contrast  Result Date: 02/04/2017 CLINICAL DATA:  Acute onset of right chest and shoulder pain. Recently diagnosed with pulmonary emboli, and question of multiple myeloma. Initial encounter. EXAM: CT ABDOMEN AND PELVIS WITH CONTRAST TECHNIQUE: Multidetector CT imaging of the abdomen and pelvis was performed using the standard protocol following bolus administration of intravenous contrast. CONTRAST:  158m ISOVUE-300 IOPAMIDOL (ISOVUE-300) INJECTION 61% COMPARISON:  CTA of the chest performed 02/03/2017 FINDINGS: Lower chest: A trace right-sided pleural effusion is noted. Right basilar airspace opacity reflects the patient's previously diagnosed right-sided pulmonary emboli. Hepatobiliary: The liver is unremarkable in appearance. Stones are noted dependently within the gallbladder. The gallbladder is otherwise unremarkable. The common bile duct remains normal in caliber. Pancreas: The pancreas is within normal limits. Spleen: The spleen is unremarkable in appearance. Adrenals/Urinary Tract: The adrenal glands are unremarkable in appearance. A small right renal cyst is noted. There is no evidence of hydronephrosis. No renal or ureteral stones are identified. No perinephric stranding is seen. Stomach/Bowel: The stomach is unremarkable in appearance. The small bowel is within normal limits. The appendix is normal in caliber, without evidence of appendicitis. The colon is unremarkable in appearance. Vascular/Lymphatic: The abdominal aorta is unremarkable in appearance. The inferior vena cava is grossly unremarkable. No retroperitoneal lymphadenopathy is seen. No pelvic sidewall  lymphadenopathy is identified. Reproductive: The bladder is mildly distended and grossly unremarkable. The prostate remains normal in size. Other: No additional soft tissue abnormalities are seen. Musculoskeletal: Scattered lucencies along the lower thoracic and lumbar spine may reflect multiple myeloma or possibly metastatic disease. There is chronic mild loss of height involving the superior endplate of TZ30 Vacuum phenomenon is noted at L4-L5, with associated sclerosis. The visualized musculature is unremarkable in appearance. IMPRESSION: 1. Scattered lucencies along the lower thoracic and lumbar spine may reflect multiple myeloma or possibly metastatic disease. Chronic mild loss of height involving the superior endplate of TQ65  2. Small right renal cyst. 3. Cholelithiasis.  Gallbladder otherwise unremarkable. 4. Trace right-sided pleural effusion. Right basilar airspace opacity reflects the previously diagnosed right-sided pulmonary emboli. Electronically Signed   By: Garald Balding M.D.   On: 02/04/2017 18:38   Dg Bone Survey Met  Result Date: 02/04/2017 CLINICAL DATA:  Known lucencies of the thoracic spine EXAM: METASTATIC BONE SURVEY COMPARISON:  02/03/2017 FINDINGS: Metastatic bone survey was performed. Skull: Small lucency is noted in the parietal bone on the lateral film of the skull. Cervical spine: Degenerative changes without acute abnormality. Bilateral shoulders: Degenerative changes of the right acromioclavicular joint is noted. No definite lucencies are seen. Bilateral humeri and forearms:  No active lesions are noted. Chest: The lesions are seen. Bibasilar changes are noted right greater than left similar to that seen recent CT examination. Thoracic spine: Pedicles are within normal limits. Vertebral body height is well maintained. The lucency seen on recent CT are not well appreciated on this exam. Lumbar spine: Degenerative changes are noted phase vertebral body height is well maintained. No  lucencies are identified. Bilateral femurs and tib-fib:  No definitive lucencies are noted. Pelvis:  No definitive lytic lesions noted. IMPRESSION: Single lucency within the parietal bone on the lateral film of the skull. No other lucencies are identified. Electronically Signed   By: Inez Catalina M.D.   On: 02/04/2017 18:24    Assessment: 56 y.o. 56 year old Spanish-speaking male, without significant past medical history, presented with white lower lobe segmental pulmonary emboli, unprovoked. CT chest also showed multiple lucent thoracic spine vertebral lesions, concerning for multiple myeloma.  1. Thoracic spinal vertebral  Lytic bone lesions, rule out MM and metastatic cancer  2. Right LLL sgemental PE, on heparin  3. Right chest pain, secondary to PE   Plan:  -IR is not able to do bone marrow biopsy on 2 Wednesday, May 23. -Okay to discharge home today with pain medication. -I recommend patient to be discharged on Xarelto, which does not need to be held for biopsy. I gave him a voucher for free Xarelto, please discharge him with a prescription of Xarelto starting package  -he needs pain medication, such as norco to go home for his pain control  -I will see him back in 2 weeks, and arrange his bone marrow biopsy as outpt.  -discussed with Dr. Erlinda Hong about the above.   Truitt Merle, MD 02/05/2017  3:38 PM

## 2017-02-05 NOTE — Progress Notes (Signed)
Appointment at [facility] 02/09/2017 Friday at 10:30 AM. Pt and daughter aware and understands.

## 2017-02-05 NOTE — Discharge Instructions (Signed)
Information on my medicine - XARELTO (rivaroxaban)  This medication education was reviewed with me or my healthcare representative as part of my discharge preparation.   WHY WAS XARELTO PRESCRIBED FOR YOU? Xarelto was prescribed to treat blood clots that may have been found in the veins of your legs (deep vein thrombosis) or in your lungs (pulmonary embolism) and to reduce the risk of them occurring again.  What do you need to know about Xarelto? The starting dose is one 15 mg tablet taken TWICE daily with food for the FIRST 21 DAYS then on 02/26/17  the dose is changed to one 20 mg tablet taken ONCE A DAY with your evening meal.  DO NOT stop taking Xarelto without talking to the health care provider who prescribed the medication.  Refill your prescription for 20 mg tablets before you run out.  After discharge, you should have regular check-up appointments with your healthcare provider that is prescribing your Xarelto.  In the future your dose may need to be changed if your kidney function changes by a significant amount.  What do you do if you miss a dose? If you are taking Xarelto TWICE DAILY and you miss a dose, take it as soon as you remember. You may take two 15 mg tablets (total 30 mg) at the same time then resume your regularly scheduled 15 mg twice daily the next day.  If you are taking Xarelto ONCE DAILY and you miss a dose, take it as soon as you remember on the same day then continue your regularly scheduled once daily regimen the next day. Do not take two doses of Xarelto at the same time.   Important Safety Information Xarelto is a blood thinner medicine that can cause bleeding. You should call your healthcare provider right away if you experience any of the following: ? Bleeding from an injury or your nose that does not stop. ? Unusual colored urine (red or dark brown) or unusual colored stools (red or black). ? Unusual bruising for unknown reasons. ? A serious fall or  if you hit your head (even if there is no bleeding).  Some medicines may interact with Xarelto and might increase your risk of bleeding while on Xarelto. To help avoid this, consult your healthcare provider or pharmacist prior to using any new prescription or non-prescription medications, including herbals, vitamins, non-steroidal anti-inflammatory drugs (NSAIDs) and supplements.  This website has more information on Xarelto: [email or web address]

## 2017-02-05 NOTE — Progress Notes (Addendum)
ANTICOAGULATION CONSULT NOTE - f/u Consult  Pharmacy Consult for heparin Indication: new pulmonary embolus  No Known Allergies  Patient Measurements: Height: 5' 9"  (175.3 cm) Weight: 205 lb (93 kg) IBW/kg (Calculated) : 70.7 Heparin Dosing Weight: 89.8 kg  Vital Signs: Temp: 97.7 F (36.5 C) (05/21 0432) Temp Source: Oral (05/21 0432) BP: 149/86 (05/21 0432) Pulse Rate: 56 (05/21 0432)  Labs:  Recent Labs  02/02/17 1414  02/02/17 1730 02/03/17 0133  02/03/17 1748 02/04/17 0546 02/05/17 0508  HGB  --   < >  --  12.7*  --   --  13.2 13.3  HCT  --   < >  --  38.0*  --   --  39.0 38.1*  PLT  --   --   --  231  --   --  237 240  APTT  --   --  33  --   --   --   --   --   LABPROT  --   --  13.2  --   --   --   --   --   INR  --   --  1.00  --   --   --   --   --   HEPARINUNFRC  --   --   --  <0.10*  < > 0.37 0.44 0.30  CREATININE 0.77  < >  --  0.81  --   --  0.79 0.83  TROPONINI <0.03  --   --   --   --   --   --   --   < > = values in this interval not displayed.  Estimated Creatinine Clearance: 115.9 mL/min (by C-G formula based on SCr of 0.83 mg/dL).   Medical History: Past Medical History:  Diagnosis Date  . Hypertension      Assessment: 64 YOF presents with rib/shoulder pain with an episode of blood-streaked sputum. CTa of chest reveals small RLL pulmonary emboli. Radiologist recommended a repeat CTa and this confirmed segmental PE to the RLL.  CT also noted to have multiple small vertebral bone lesions concerning for lytic metastatic disease or multiple myeloma. Pharmacy dosing IV heparin. Baseline anticoagulation labs were WNL.  Today, 02/05/17  Heparin level remains therapeutic at 0.30 but is at lower end of range   CBC stable  No bleeding documented  Plan for possible bone marrow biopsy on 5/22.  Spoke to [employer] staff, stated that heparin (and anticoagulants in general) does not need to help for this procedure  Goal of Therapy:  Heparin level 0.3-0.7  units/ml Monitor platelets by anticoagulation protocol: Yes   Plan:   Increase heparin infusion slightly to 2100 units/hr  check 6 hr heparin level  Daily CBC and heparin level while on heparin infusion.   Monitor closely for s/sx of bleeding.   Adden (at 1403): To transition patient to [place] today with plan to d/c patient home. Tiffany from IR spoke to Dr. Burr Medico-- plan is for possible biopsy on 5/22 if able to fit patient in or later in a week or two. Tiffany informed Dr. Burr Medico that xarelto does not need to be held for this procedure.   - d/c heparin drip at 4p and start xarelto 15 mg bid for 21 days, then 20 mg daily.  Dia Sitter, PharmD, BCPS 02/05/2017 8:42 AM

## 2017-02-06 LAB — PROTEIN ELECTROPHORESIS, SERUM
A/G Ratio: 1 (ref 0.7–1.7)
Albumin ELP: 3 g/dL (ref 2.9–4.4)
Alpha-1-Globulin: 0.3 g/dL (ref 0.0–0.4)
Alpha-2-Globulin: 1 g/dL (ref 0.4–1.0)
BETA GLOBULIN: 0.8 g/dL (ref 0.7–1.3)
GAMMA GLOBULIN: 0.9 g/dL (ref 0.4–1.8)
Globulin, Total: 3.1 g/dL (ref 2.2–3.9)
M-SPIKE, %: 0.2 g/dL — AB
TOTAL PROTEIN ELP: 6.1 g/dL (ref 6.0–8.5)

## 2017-02-06 LAB — UIFE/LIGHT CHAINS/TP QN, 24-HR UR
% BETA, URINE: 0 %
ALBUMIN, U: 100 %
ALPHA 1 URINE: 0 %
Alpha 2, Urine: 0 %
Free Kappa/Lambda Ratio: 9.13 (ref 2.04–10.37)
Free Lambda Lt Chains,Ur: 0.63 mg/L (ref 0.24–6.66)
Free Lt Chn Excr Rate: 5.75 mg/L (ref 1.35–24.19)
GAMMA GLOBULIN URINE: 0 %
TOTAL PROTEIN, URINE-UPE24: 5.9 mg/dL
TOTAL PROTEIN, URINE-UR/DAY: 207 mg/(24.h) — AB (ref 30–150)
Total Volume: 3500

## 2017-02-07 ENCOUNTER — Ambulatory Visit: Payer: Self-pay | Attending: Internal Medicine

## 2017-02-07 LAB — IMMUNOFIXATION ELECTROPHORESIS
IGA: 162 mg/dL (ref 90–386)
IGG (IMMUNOGLOBIN G), SERUM: 1018 mg/dL (ref 700–1600)
IgM, Serum: 35 mg/dL (ref 20–172)
TOTAL PROTEIN ELP: 6.8 g/dL (ref 6.0–8.5)

## 2017-02-08 ENCOUNTER — Encounter (HOSPITAL_COMMUNITY): Payer: Self-pay

## 2017-02-08 ENCOUNTER — Emergency Department (HOSPITAL_COMMUNITY)
Admission: EM | Admit: 2017-02-08 | Discharge: 2017-02-08 | Disposition: A | Discharge status: Home / Self Care | Payer: Self-pay | Attending: Emergency Medicine | Admitting: Emergency Medicine

## 2017-02-08 DIAGNOSIS — M79604 Pain in right leg: Secondary | ICD-10-CM | POA: Insufficient documentation

## 2017-02-08 DIAGNOSIS — M545 Low back pain, unspecified: Secondary | ICD-10-CM

## 2017-02-08 DIAGNOSIS — Z79899 Other long term (current) drug therapy: Secondary | ICD-10-CM | POA: Insufficient documentation

## 2017-02-08 DIAGNOSIS — M79605 Pain in left leg: Secondary | ICD-10-CM | POA: Insufficient documentation

## 2017-02-08 DIAGNOSIS — I10 Essential (primary) hypertension: Secondary | ICD-10-CM | POA: Insufficient documentation

## 2017-02-08 DIAGNOSIS — Z86711 Personal history of pulmonary embolism: Secondary | ICD-10-CM | POA: Insufficient documentation

## 2017-02-08 DIAGNOSIS — Z7902 Long term (current) use of antithrombotics/antiplatelets: Secondary | ICD-10-CM | POA: Insufficient documentation

## 2017-02-08 DIAGNOSIS — F17210 Nicotine dependence, cigarettes, uncomplicated: Secondary | ICD-10-CM | POA: Insufficient documentation

## 2017-02-08 HISTORY — DX: Other pulmonary embolism without acute cor pulmonale: I26.99

## 2017-02-08 LAB — PATHOLOGIST SMEAR REVIEW

## 2017-02-08 LAB — CBC WITH DIFFERENTIAL/PLATELET
BASOS PCT: 0 %
Basophils Absolute: 0 10*3/uL (ref 0.0–0.1)
EOS PCT: 2 %
Eosinophils Absolute: 0.1 10*3/uL (ref 0.0–0.7)
HEMATOCRIT: 42 % (ref 39.0–52.0)
HEMOGLOBIN: 14.9 g/dL (ref 13.0–17.0)
Lymphocytes Relative: 21 %
Lymphs Abs: 1.3 10*3/uL (ref 0.7–4.0)
MCH: 30.4 pg (ref 26.0–34.0)
MCHC: 35.5 g/dL (ref 30.0–36.0)
MCV: 85.7 fL (ref 78.0–100.0)
MONOS PCT: 3 %
Monocytes Absolute: 0.2 10*3/uL (ref 0.1–1.0)
NEUTROS PCT: 74 %
Neutro Abs: 4.8 10*3/uL (ref 1.7–7.7)
Platelets: 168 10*3/uL (ref 150–400)
RBC: 4.9 MIL/uL (ref 4.22–5.81)
RDW: 13.3 % (ref 11.5–15.5)
WBC: 6.4 10*3/uL (ref 4.0–10.5)

## 2017-02-08 LAB — URINALYSIS, ROUTINE W REFLEX MICROSCOPIC
BILIRUBIN URINE: NEGATIVE
Glucose, UA: NEGATIVE mg/dL
Ketones, ur: NEGATIVE mg/dL
Leukocytes, UA: NEGATIVE
Nitrite: NEGATIVE
Protein, ur: NEGATIVE mg/dL
SPECIFIC GRAVITY, URINE: 1.017 (ref 1.005–1.030)
SQUAMOUS EPITHELIAL / LPF: NONE SEEN
pH: 5 (ref 5.0–8.0)

## 2017-02-08 LAB — COMPREHENSIVE METABOLIC PANEL
ALT: 72 U/L — ABNORMAL HIGH (ref 17–63)
AST: 191 U/L — AB (ref 15–41)
Albumin: 3.8 g/dL (ref 3.5–5.0)
Alkaline Phosphatase: 215 U/L — ABNORMAL HIGH (ref 38–126)
Anion gap: 9 (ref 5–15)
BUN: 20 mg/dL (ref 6–20)
CHLORIDE: 104 mmol/L (ref 101–111)
CO2: 23 mmol/L (ref 22–32)
Calcium: 9.3 mg/dL (ref 8.9–10.3)
Creatinine, Ser: 1.01 mg/dL (ref 0.61–1.24)
GFR calc Af Amer: >60 mL/min (ref >60)
Glucose, Bld: 131 mg/dL — ABNORMAL HIGH (ref 65–99)
POTASSIUM: 4.4 mmol/L (ref 3.5–5.1)
SODIUM: 136 mmol/L (ref 135–145)
Total Bilirubin: 0.5 mg/dL (ref 0.3–1.2)
Total Protein: 7.4 g/dL (ref 6.5–8.1)

## 2017-02-08 LAB — CK: CK TOTAL: 24 U/L — AB (ref 49–397)

## 2017-02-08 LAB — PROTIME-INR
INR: 1.32
Prothrombin Time: 16.5 seconds — ABNORMAL HIGH (ref 11.4–15.2)

## 2017-02-08 LAB — MAGNESIUM: Magnesium: 1.8 mg/dL (ref 1.7–2.4)

## 2017-02-08 LAB — I-STAT TROPONIN, ED: TROPONIN I, POC: 0 ng/mL (ref 0.00–0.08)

## 2017-02-08 LAB — I-STAT CG4 LACTIC ACID, ED: LACTIC ACID, VENOUS: 1.5 mmol/L (ref 0.5–1.9)

## 2017-02-08 MED ORDER — OXYCODONE-ACETAMINOPHEN 5-325 MG PO TABS: 2.0000 | ORAL_TABLET | ORAL | 0 refills | Status: DC | PRN

## 2017-02-08 MED ORDER — SODIUM CHLORIDE 0.9 % IV SOLN
Freq: Once | INTRAVENOUS | Status: AC
  Administered 2017-02-08: 125 mL/h via INTRAVENOUS

## 2017-02-08 MED ORDER — POLYETHYLENE GLYCOL 3350 17 G PO PACK: 17.0000 g | PACK | Freq: Every day | ORAL | 0 refills | Status: AC

## 2017-02-08 MED ORDER — OXYCODONE-ACETAMINOPHEN 5-325 MG PO TABS
2.0000 | ORAL_TABLET | Freq: Once | ORAL | Status: AC
  Administered 2017-02-08: 2 via ORAL
  Filled 2017-02-08: qty 2

## 2017-02-08 MED ORDER — DOCUSATE SODIUM 100 MG PO CAPS: 100.0000 mg | ORAL_CAPSULE | Freq: Two times a day (BID) | ORAL | 0 refills | Status: AC

## 2017-02-08 NOTE — ED Triage Notes (Signed)
Pt was just inpatient here for PE, he was released on Monday and last night he started to complain of lower back pain and bilateral arm numbness

## 2017-02-08 NOTE — ED Provider Notes (Signed)
[facility] DEPT Provider Note   CSN: [id number] Arrival date & time: 02/08/17  7619     History   Chief Complaint Chief Complaint  Patient presents with  . Back Pain  . Numbness    HPI Guy Walker is a 56 y.o. male.  HPI Patient was discharged from the hospital 2 days ago. He was hospitalized for pulmonary embolus and started on Xarelto. Patient was doing all right. He reports starting last night at about midnight he began getting intense cramping pains that were migrating between his upper arms and both legs. At times it would be a feeling of coldness or numbness with cramping in either one arm or the other. At other times his legs would have cramping pains throughout them. Currently as I'm seen the patient, he is not having arm pain but getting periodic cramps of the legs. He has not documented a fever that he is aware of. He reports that the pain he was having in the right side of his chest is controlled by the pain medication that was prescribed and that seems better. He took half of an ibuprofen at about 1 AM and 1 Vicodin at 5 AM. These cramping and migratory pains are new. Xarelto is a new medication. Past Medical History:  Diagnosis Date  . Hypertension   . Pulmonary embolism [facility])     Patient Active Problem List   Diagnosis Date Noted  . Bone lesion   . Pulmonary emboli ([place]) 02/02/2017  . Heart palpitations 12/20/2015  . Neuropathy 12/20/2015  . Language barrier to communication 12/20/2015  . Abscess & cellulitis of thigh s/p I&D [id number] 11/01/2013  . 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 Sepsis ([place]) 10/27/2013    Past Surgical History:  Procedure Laterality Date  . INCISION AND DRAINAGE ABSCESS N/A 10/30/2013   Procedure: INCISION AND DRAINAGE ABSCESS LEFT INNER GROIN;  Surgeon: Pedro Earls, MD;  Location: [facility] ORS;  Service: General;  Laterality: N/A;  . Nodules removed from throat  1970's       Home Medications    Prior to  Admission medications   Medication Sig Start Date End Date Taking? Authorizing Provider  HYDROcodone-acetaminophen (NORCO) 5-325 MG tablet Take 1 tablet by mouth every 6 (six) hours as needed for moderate pain. 02/05/17  Yes Florencia Reasons, MD  ibuprofen (ADVIL,MOTRIN) 800 MG tablet Take 1 tablet (800 mg total) by mouth every 8 (eight) hours as needed. Patient taking differently: Take 400-800 mg by mouth every 8 (eight) hours as needed.  02/05/17  Yes Florencia Reasons, MD  Rivaroxaban 15 & 20 MG TBPK Take as directed on package: Start with one 15mg  tablet by mouth twice a day with food. On Day 22, switch to one 20mg  tablet once a day with food. 02/05/17  Yes Florencia Reasons, MD  amLODipine (NORVASC) 5 MG tablet Take 1 tablet (5 mg total) by mouth daily. 02/09/17   Scot Jun, FNP  docusate sodium (COLACE) 100 MG capsule Take 1 capsule (100 mg total) by mouth every 12 (twelve) hours. 02/08/17   Charlesetta Shanks, MD  oxyCODONE-acetaminophen (PERCOCET) 5-325 MG tablet Take 2 tablets by mouth every 4 (four) hours as needed. 02/08/17   Charlesetta Shanks, MD  polyethylene glycol (MIRALAX / GLYCOLAX) packet Take 17 g by mouth daily. 02/08/17   Charlesetta Shanks, MD  rivaroxaban (XARELTO) 20 MG TABS tablet Take 1 tablet (20 mg total) by mouth daily with supper. 02/09/17   Scot Jun, FNP    Family  History Family History  Problem Relation Age of Onset  . Hypertension Mother   . Heart failure Father     Social History Social History  Substance Use Topics  . Smoking status: Current Every Day Smoker    Packs/day: 0.25    Types: Cigarettes  . Smokeless tobacco: Never Used  . Alcohol use 2.4 oz/week    2 Cans of beer, 2 Shots of liquor per week     Allergies   Patient has no known allergies.   Review of Systems Review of Systems 10 Systems reviewed and are negative for acute change except as noted in the HPI.   Physical Exam Updated Vital Signs BP 103/90   Pulse (!) 105   Temp 98.1 F (36.7 C) (Oral)    Resp 15   SpO2 95%   Physical Exam  Constitutional: He is oriented to person, place, and time. He appears well-developed and well-nourished.  HENT:  Head: Normocephalic and atraumatic.  Nose: Nose normal.  Mouth/Throat: Oropharynx is clear and moist.  Eyes: Conjunctivae and EOM are normal.  Neck: Neck supple.  Cardiovascular: Normal rate, regular rhythm, normal heart sounds and intact distal pulses.   No murmur heard. Pulmonary/Chest: Effort normal. No respiratory distress.  Rales at right lung base. Normal airflow without wheeze or rail in the left lung. No respiratory distress.  Abdominal: Soft. He exhibits no distension and no mass. There is no tenderness. There is no guarding.  Musculoskeletal: Normal range of motion. He exhibits no edema, tenderness or deformity.  Soft tissues of both upper and the lower extremities is normal. He does not have reproducible pain to palpation of any of the soft tissues. There are no areas of erythema, swelling or cellulitis. Normal range of motion without difficulty. No focal point tenderness in the back. He does however endorse that he is getting pain in his very low lumbar spine periodically.  Neurological: He is alert and oriented to person, place, and time. No cranial nerve deficit. He exhibits normal muscle tone. Coordination normal.  Skin: Skin is warm and dry.  Psychiatric: He has a normal mood and affect.  Nursing note and vitals reviewed.    ED Treatments / Results  Labs (all labs ordered are listed, but only abnormal results are displayed) Labs Reviewed  COMPREHENSIVE METABOLIC PANEL - Abnormal; Notable for the following:       Result Value   Glucose, Bld 131 (*)    AST 191 (*)    ALT 72 (*)    Alkaline Phosphatase 215 (*)    All other components within normal limits  PROTIME-INR - Abnormal; Notable for the following:    Prothrombin Time 16.5 (*)    All other components within normal limits  CK - Abnormal; Notable for the  following:    Total CK 24 (*)    All other components within normal limits  URINALYSIS, ROUTINE W REFLEX MICROSCOPIC - Abnormal; Notable for the following:    Hgb urine dipstick SMALL (*)    Bacteria, UA FEW (*)    All other components within normal limits  CBC WITH DIFFERENTIAL/PLATELET  MAGNESIUM  PATHOLOGIST SMEAR REVIEW  I-STAT TROPOININ, ED  I-STAT CG4 LACTIC ACID, ED    EKG  EKG Interpretation None       Radiology No results found.  Procedures Procedures (including critical care time)  Medications Ordered in ED Medications  0.9 %  sodium chloride infusion ( Intravenous Stopped 02/08/17 1221)  oxyCODONE-acetaminophen (PERCOCET/ROXICET) 5-325 MG per tablet  2 tablet (2 tablets Oral Given 02/08/17 1004)     Initial Impression / Assessment and Plan / ED Course  I have reviewed the triage vital signs and the nursing notes.  Pertinent labs & imaging results that were available during my care of the patient were reviewed by me and considered in my medical decision making (see chart for details).    After administration of pain medications, patient was reassessed prior to discharge. He is able to stand and ambulate with normal gait. He does not show any signs of weakness or pain limitations in physical and motor activity.  Final Clinical Impressions(s) / ED Diagnoses   Final diagnoses:  Acute bilateral low back pain without sciatica  Leg pain, bilateral  History of pulmonary embolus (PE)  At this time, etiology of pain is unclear. Patient is clinically well in appearance. He is nontoxic and alert. This does not appear to be infectious etiology. The pain is migratory and variably has been in the arms and the legs. There is no motor dysfunction or neurologic compromise. Pain seems to be spasmodic, cramping and muscular nature. Patient has just been started on rivaroxaban due to PE. DVT studies were done and negative. Nonspecific lucencies of spine were identified on his CT  scan during hospitalization in the lower thoracic and lumbar spine. At this time further diagnostic evaluation of these lesions is scheduled with Dr. Burr Medico. Notably however the pain does not seem to be localizing in the patient's back or spine. At this time plan will be to increase pain control and had the patient continue with follow-up plan for further diagnostic studies.  New Prescriptions Discharge Medication List as of 02/08/2017 12:07 PM    START taking these medications   Details  docusate sodium (COLACE) 100 MG capsule Take 1 capsule (100 mg total) by mouth every 12 (twelve) hours., Starting Thu 02/08/2017, Print    oxyCODONE-acetaminophen (PERCOCET) 5-325 MG tablet Take 2 tablets by mouth every 4 (four) hours as needed., Starting Thu 02/08/2017, Print    polyethylene glycol (MIRALAX / GLYCOLAX) packet Take 17 g by mouth daily., Starting Thu 02/08/2017, Print         Charlesetta Shanks, MD 02/14/17 [phone]

## 2017-02-08 NOTE — ED Notes (Signed)
RN starting a line and collecting blood work. 

## 2017-02-08 NOTE — ED Notes (Signed)
Signature pad not working. 

## 2017-02-08 NOTE — Discharge Instructions (Signed)
Your pain may be due to musculoskeletal strain. You however also may have tumors involving her bones. This will need further evaluation by Dr. Burr Medico as planned.  You may take 1-2 Percocet every 4 hours if needed for pain. Be sure to take both Colace and MiraLAX to make sure you do not develop constipation while taking a narcotic pain medication like percocet.  Return to the emergency department immediately if you develop weakness or numbness in your legs that is making it difficult for you to walk. Return if you develop other concerning symptoms. See your doctor for follow-up tomorrow as scheduled.

## 2017-02-09 ENCOUNTER — Encounter: Payer: Self-pay | Admitting: Family Medicine

## 2017-02-09 ENCOUNTER — Ambulatory Visit (INDEPENDENT_AMBULATORY_CARE_PROVIDER_SITE_OTHER): Payer: Self-pay | Admitting: Family Medicine

## 2017-02-09 VITALS — BP 140/80 | HR 90 | Temp 98.6°F | Resp 16 | Ht 69.0 in | Wt 201.0 lb

## 2017-02-09 DIAGNOSIS — I2699 Other pulmonary embolism without acute cor pulmonale: Secondary | ICD-10-CM

## 2017-02-09 DIAGNOSIS — I10 Essential (primary) hypertension: Secondary | ICD-10-CM

## 2017-02-09 DIAGNOSIS — Z7901 Long term (current) use of anticoagulants: Secondary | ICD-10-CM

## 2017-02-09 DIAGNOSIS — M898X9 Other specified disorders of bone, unspecified site: Secondary | ICD-10-CM

## 2017-02-09 LAB — CBC WITH DIFFERENTIAL/PLATELET
BASOS ABS: 0 {cells}/uL (ref 0–200)
Basophils Relative: 0 %
Eosinophils Absolute: 63 cells/uL (ref 15–500)
Eosinophils Relative: 1 %
HEMATOCRIT: 42.5 % (ref 38.5–50.0)
HEMOGLOBIN: 14.4 g/dL (ref 13.2–17.1)
LYMPHS ABS: 1071 {cells}/uL (ref 850–3900)
Lymphocytes Relative: 17 %
MCH: 29.9 pg (ref 27.0–33.0)
MCHC: 33.9 g/dL (ref 32.0–36.0)
MCV: 88.2 fL (ref 80.0–100.0)
MONO ABS: 252 {cells}/uL (ref 200–950)
MPV: 9.7 fL (ref 7.5–12.5)
Monocytes Relative: 4 %
NEUTROS ABS: 4914 {cells}/uL (ref 1500–7800)
NEUTROS PCT: 78 %
Platelets: 141 10*3/uL (ref 140–400)
RBC: 4.82 MIL/uL (ref 4.20–5.80)
RDW: 14 % (ref 11.0–15.0)
WBC: 6.3 10*3/uL (ref 3.8–10.8)

## 2017-02-09 LAB — POCT URINALYSIS DIP (DEVICE)
BILIRUBIN URINE: NEGATIVE
GLUCOSE, UA: NEGATIVE mg/dL
Ketones, ur: NEGATIVE mg/dL
LEUKOCYTES UA: NEGATIVE
Nitrite: NEGATIVE
Protein, ur: 30 mg/dL — AB
SPECIFIC GRAVITY, URINE: 1.025 (ref 1.005–1.030)
UROBILINOGEN UA: 2 mg/dL — AB (ref 0.0–1.0)
pH: 6 (ref 5.0–8.0)

## 2017-02-09 MED ORDER — RIVAROXABAN 20 MG PO TABS: 20.0000 mg | ORAL_TABLET | Freq: Every day | ORAL | 3 refills | Status: DC

## 2017-02-09 MED ORDER — AMLODIPINE BESYLATE 5 MG PO TABS: 5.0000 mg | ORAL_TABLET | Freq: Every day | ORAL | 0 refills | Status: AC

## 2017-02-09 NOTE — Progress Notes (Signed)
Patient ID: Guy Walker, male    DOB: 25-Apr-1961, 56 y.o.   MRN: [id number]  PCP: Scot Jun, FNP  Chief Complaint  Patient presents with  . Establish Care  . Hospitalization Follow-up    blood clots right lung    Subjective:  HPI Daughter is present during visit and interpreting  Guy Walker is a 56 y.o. male presents to establish care and hospital follow-up. Prior to recent hospitalization, medical history was only significant for long term current tobacco use. Treon presented to [facility] 02/02/2017 with a complain of right chest wall and shoulder pain with associated hemoptysis. During admission, CTA confirmed the presence of right lobe emboli. CT of chest revealed multiple  suspicious vertebral lesions. Concern for multiple myeloma and or metastatic disease.  He was referred for oncology consult with Dr. Burr Medico on an outpatient basis for further evaluation of bone lesions. Denies any prior abnormal clotting diagnosis or history of blood clot. He has smoked cigarettes for years. Quit previously for a short period of time and resumed smoking again last year. He was discharged with a Xarelto starter pack for anticoagulant therapy. He is uninsured and will need patient assistance in order to afford medication. He was started on Amlodipine for hypertension treatment. Denies any associated dizziness or swelling since starting medication.  On yesterday, he presented to [facility] emergency department for worsening MSK pain.  He was prescribed Percocet for pain and discharged. Guy Walker reports improvement of chest pain. However, he feels general aching in his bones making it difficult to sleep without taking pain medication. Denies any further episodes of hemoptysis or any know history or exposure to TB. Tolerating Xarelto without any episodes of bleeding.  Today, he is needs a referral to oncology as he has not been contacted to schedule an  appointment with Dr. Burr Medico.   Social History   Social History  . Marital status: Married    Spouse name: N/A  . Number of children: N/A  . Years of education: N/A   Occupational History  . Not on file.   Social History Main Topics  . Smoking status: Current Every Day Smoker    Packs/day: 0.25    Types: Cigarettes  . Smokeless tobacco: Never Used  . Alcohol use 2.4 oz/week    2 Cans of beer, 2 Shots of liquor per week  . Drug use: No  . Sexual activity: Not Currently   Other Topics Concern  . Not on file   Social History Narrative  . No narrative on file    Family History  Problem Relation Age of Onset  . Hypertension Mother   . Heart failure Father    Review of Systems See HPI  Patient Active Problem List   Diagnosis Date Noted  . Bone lesion   . Pulmonary emboli ([place]) 02/02/2017  . Heart palpitations 12/20/2015  . Neuropathy 12/20/2015  . Language barrier to communication 12/20/2015  . Abscess & cellulitis of thigh s/p I&D [id number] 11/01/2013  . 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 Sepsis ([place]) 10/27/2013    No Known Allergies  Prior to Admission medications   Medication Sig Start Date End Date Taking? Authorizing Provider  amLODipine (NORVASC) 5 MG tablet Take 1 tablet (5 mg total) by mouth daily. 02/06/17  Yes Florencia Reasons, MD  docusate sodium (COLACE) 100 MG capsule Take 1 capsule (100 mg total) by mouth every 12 (twelve) hours. 02/08/17  Yes Charlesetta Shanks, MD  HYDROcodone-acetaminophen ([place]) 5-325 MG  tablet Take 1 tablet by mouth every 6 (six) hours as needed for moderate pain. 02/05/17  Yes Florencia Reasons, MD  ibuprofen (ADVIL,MOTRIN) 800 MG tablet Take 1 tablet (800 mg total) by mouth every 8 (eight) hours as needed. Patient taking differently: Take 400-800 mg by mouth every 8 (eight) hours as needed.  02/05/17  Yes Florencia Reasons, MD  oxyCODONE-acetaminophen (PERCOCET) 5-325 MG tablet Take 2 tablets by mouth every 4 (four) hours as needed. 02/08/17  Yes Pfeiffer,  Jeannie Done, MD  polyethylene glycol (MIRALAX / GLYCOLAX) packet Take 17 g by mouth daily. 02/08/17  Yes Charlesetta Shanks, MD  Rivaroxaban 15 & 20 MG TBPK Take as directed on package: Start with one 103m tablet by mouth twice a day with food. On Day 22, switch to one 230mtablet once a day with food. 02/05/17  Yes XuFlorencia ReasonsMD    Past Medical, Surgical Family and Social History reviewed and updated.    Objective:   Today's Vitals   02/09/17 1101  BP: 140/80  Pulse: 90  Resp: 16  Temp: 98.6 F (37 C)  TempSrc: Oral  SpO2: 99%  Weight: 201 lb (91.2 kg)  Height: 5' 9"  (1.753 m)    Wt Readings from Last 3 Encounters:  02/09/17 201 lb (91.2 kg)  02/02/17 205 lb (93 kg)  01/05/17 205 lb (93 kg)   Physical Exam  Constitutional: He is oriented to person, place, and time. He appears well-developed and well-nourished.  HENT:  Head: Normocephalic and atraumatic.  Eyes: Conjunctivae are normal. Pupils are equal, round, and reactive to light.  Neck: Normal range of motion. Neck supple. No thyromegaly present.  Cardiovascular: Normal rate, regular rhythm, normal heart sounds and intact distal pulses.   Pulmonary/Chest: Effort normal and breath sounds normal. He exhibits no tenderness, no crepitus and no retraction.  Abdominal: Soft. Bowel sounds are normal. He exhibits no distension and no mass. There is no tenderness. There is no rebound and no guarding.  Musculoskeletal: He exhibits tenderness.  Generalized subjective bone pain   Neurological: He is alert and oriented to person, place, and time.  Skin: Skin is warm and dry.  Psychiatric: He has a normal mood and affect. His behavior is normal. Judgment and thought content normal.   Assessment & Plan:  1. Other pulmonary embolism without acute cor pulmonale, unspecified chronicity [place]. Bone pain 3. Essential hypertension 4. Anticoagulated Plan: - COMPLETE METABOLIC PANEL WITH GFR - CBC with Differential - PSA - Ambulatory referral  to Oncology -Continue Xarelto starter pack as prescribed. Prescription submitted to [facility] for future refills. 4 weeks of -Xarelto samples provided today. Patient is uninsured. -Agreed to fill limited pain medication as patient has significant bone lesions consisted with possible malignancy. Advised daughter once pain medication is in need of refilling, a pain contract will be require and patient must pick up medications in person.  RTC: 1 month 03/12/2017  KiCarroll SageHaKenton KingfisherMSN, FNP-C The Patient Care [id number] Littleton StreetvBarbara CowerrMedwayNC [phone]

## 2017-02-09 NOTE — Patient Instructions (Signed)
Please return for follow-up in 4 weeks here at the clinic.  I have referred you to oncology, please call me here at the office if you have not receive a call regarding an appointment within 2 weeks.  I have submitted a refill for your Xarelto . I will receive a form in order to have you approved for patient assistance in order to be able to obtain this medication at a discounted or no cost.     Embolia pulmonar (Pulmonary Embolism) La embolia pulmonar (EP) es la obstruccin repentina o la disminucin de la irrigacin de sangre a uno o ambos pulmones. Cornell obstrucciones se produce a causa de un cogulo de sangre que se desplaza desde las piernas o la pelvis [facility] pulmones. La EP es una afeccin peligrosa y potencialmente mortal si no se trata de inmediato. CAUSAS La embolia pulmonar ocurre con mayor frecuencia cuando un cogulo de sangre se desplaza desde una de las venas [employer] International. En contadas ocasiones, se debe al desplazamiento de aire, grasa, lquido amnitico o una porcin de un tumor a travs de las venas [employer] International. FACTORES DE RIESGO Es ms probable que la EP se manifieste en los siguientes casos:  Los fumadores.  Las personas de edad avanzada, [place].  Las personas que tienen sobrepeso (obesidad).  Las [place] [employer] permanecen sentadas o acostadas durante mucho tiempo, por ejemplo, durante un viaje de larga distancia (de ms de 4horas), mientras hacen reposo en cama, durante una hospitalizacin o [place] se recuperan de determinadas afecciones, como un ictus.  Las personas que no hacen mucha actividad fsica (estilo de vida sedentario).  Las personas que sufren trastornos respiratorios crnicos.  Las personas que tienen antecedentes personales o familiares de cogulos de [place] o enfermedades relacionadas con la coagulacin sangunea.  Las personas que tienen enfermedades vasculares perifricas (EVP), diabetes o  algunos tipos de cncer.  Las personas que sufren enfermedades cardacas, en especial si recientemente tuvieron un infarto de miocardio o padecen insuficiencia cardaca congestiva.  Las personas que tienen enfermedades neurolgicas que afectan las piernas (paresia de las piernas).  Las personas que sufrieron una lesin traumtica, como una fractura en la cadera o la pierna.  Las que recientemente se sometieron a Marine scientist mayor o prolongada, en especial de cadera, rodilla o abdomen.  Las personas a las que se les coloc una va central en una vena de gran tamao.  Las personas que toman medicamentos que contienen la hormona estrgeno, entre ellos, los anticonceptivos y la terapia de Geographical information systems officer hormonal.  El embarazo o durante el [place], o el perodo del posparto. SIGNOS Y SNTOMAS Generalmente, los sntomas de EP comienzan repentinamente e incluyen lo siguiente:  Falta de aire mientras descansa o est en actividad.  Tos con o sin sangre, o mucosidad sanguinolenta.  Dolor de pecho que suele empeorar al respirar hondo.  Latidos cardacos rpidos o irregulares.  Sentirse aturdido o [employer].  Desmayos.  Sentir ansiedad.  Sudoracin. Tambin puede haber dolor e hinchazn en una pierna si all se form el cogulo sanguneo. Estos sntomas pueden representar un problema grave que constituye Engineer, maintenance (IT). No espere hasta que los sntomas desaparezcan. Solicite atencin mdica de inmediato. Comunquese con el servicio de emergencias de su localidad (911 en los [place]). No conduzca por sus propios medios Principal Financial. DIAGNSTICO El mdico le har una historia clnica y un examen fsico. Tambin pueden hacerle otros estudios, por ejemplo:  Anlisis de [place] para  evaluar las propiedades de coagulacin de la sangre y los niveles de oxgeno en la [place], y Hydrographic surveyor la presencia de cogulos sanguneos.  Estudios de diagnstico por imgenes, por ejemplo, tomografas  computarizadas (TC), ecografas, resonancias magnticas (RM), radiografas y otros estudios para determinar si hay cogulos en cualquier parte del cuerpo.  Un electrocardiograma (EC) para determinar si hay sobrecarga cardaca debido a los cogulos sanguneos en los pulmones. TRATAMIENTO Los principales objetivos del tratamientos de la EP son los siguientes:  Evitar el agrandamiento del cogulo sanguneo.  Evitar la formacin de nuevos cogulos sanguneos. El tipo de tratamiento que se administra depende de muchos factores, como la causa de la EP, el riesgo de tener una hemorragia o ms cogulos, y Scientist, research (medical) enfermedades que padezca. A veces, se necesita una combinacin de tratamientos. El tratamiento de esta afeccin puede incluir lo siguiente:  Medicamentos, entre ellos, anticoagulantes ms nuevos por va oral, warfarina, heparinas de [employer], trombolticos o heparinas.  Usar medias de compresin o diferentes tipos de dispositivos.  Ciruga (en contadas ocasiones) para extraer el cogulo de Biochemist, clinical o colocar un filtro en el abdomen para evitar que este se desplace hasta los pulmones. Los tratamientos para la EP suelen dividirse en el tratamiento inmediato, el tratamiento a largo plazo (hasta 66meses despus de la EP) y Dispensing optician prolongado (ms de 39meses despus de la EP). El tratamiento puede continuar por varios meses. Esto se conoce como tratamiento de mantenimiento y se [place] para Mining engineer formacin de nuevos cogulos de [place]. Puede trabajar junto con el mdico para elegir el programa de tratamiento ms adecuado para su caso. Qu son los anticoagulantes? Los anticoagulantes son medicamentos que se utilizan para tratar las [employer]ak. Pueden impedir la formacin de nuevos cogulos de sangre y el aumento de tamao de los ya existentes. No pueden disolver los cogulos ya formados. Con el tiempo, el organismo disuelve los cogulos por s solo. Los anticoagulantes  pueden administrarse por va oral, mediante inyecciones o a travs de una va intravenosa (IV). Qu son los trombolticos? Los trombolticos son medicamentos que Kohl's cogulos y se usan para Actuary [place]. Conllevan un alto riesgo de hemorragia, de modo que suelen usarse solamente cuando los casos son graves o si tiene la presin arterial muy baja. INSTRUCCIONES PARA EL CUIDADO EN EL HOGAR Si est tomando un anticoagulante ms nuevo por va oral:  Tmelo todos los das a la misma hora.  Entienda cules son los alimentos y los medicamentos que interactan con este frmaco.  Entienda que no es necesario realizar anlisis de sangre peridicos cuando se toma este medicamento.  Entienda los efectos secundarios de este medicamento, incluida la excesiva formacin de hematomas o las hemorragias. Consulte al mdico o al farmacutico acerca de otros efectos secundarios posibles. Si est tomando warfarina:  Entienda cmo tomar Coca-Cola y sepa cules son los alimentos que pueden incidir en el efecto que este tiene en el organismo.  Entienda que es peligroso tomar dosis demasiado altas o bajas de warfarina. El exceso de warfarina aumenta el riesgo de [place]. Dosis demasiado bajas de warfarina aumentan el riesgo de formacin de cogulos.  Siga el cronograma de anlisis de sangre del tiempo de protrombina (PT) y el ndice internacional normalizado (INR). Los resultados del PT y del INR permiten al mdico ajustar la dosis de warfarina. Es muy importante que los [employer] del PT y el INR se realicen con la frecuencia que el mdico le haya indicado.  Evite hacer cambios importantes  en la dieta o hable con el mdico antes de cambiarla. Solicite una cita con un nutricionista matriculado para hacerle las preguntas que le surjan. Muchos alimentos, especialmente los que tienen alto contenido de vitamina K, pueden alterar el efecto de la warfarina, y C.H. [employer] Worldwide del PT y del INR. Consuma  siempre una buena cantidad de alimentos con alto contenido de vitaminaK, entre ellos,  espinaca, col rizada, brcoli, repollo, coles berzas, hojas de nabo, repollitos de [place], guisantes, coliflor, algas y perejil.  Hgado de vaca y de cerdo.  T verde.  Aceite de soja.  Informe al [employer] International, las vitaminas y los suplementos que toma, entre ellos, aspirina y otros antiinflamatorios de Radio broadcast assistant. Tenga especial cuidado con la aspirina y los medicamentos antiinflamatorios. No tome esos medicamentos sin antes consultarle al mdico si es seguro hacerlo. Esto es importante porque muchos medicamentos pueden interferir en el efecto de la warfarina e incidir en los [place] del PT y del INR.  No empiece a tomar ni suspenda ningn medicamento recetado o de venta libre, a menos que el mdico o el farmacutico se lo indiquen. Si toma warfarina, tambin tendr [employer] lo siguiente:  Oceanographer presin sobre las heridas durante ms tiempo que lo habitual.  Informar al dentista y a otros mdicos que est tomando warfarina, antes de someterse a cualquier procedimiento en el que pudieran producirse hemorragias.  Evitar el consumo de alcohol o beber cantidades muy pequeas. Informe al mdico si modifica el consumo de alcohol.  No consuma productos que contengan tabaco, incluidos cigarrillos, tabaco de Higher education careers adviser y Psychologist, sport and exercise. Si necesita ayuda para dejar de fumar, consulte al mdico.  Evite los deportes de [place]. Instrucciones generales  Delphi de venta libre y los recetados solamente como se lo haya indicado el mdico. Los anticoagulantes pueden causar efectos secundarios, entre ellos, la tendencia a la formacin de hematomas y la dificultad para Scientist, water quality las hemorragias. Si le recetaron un anticoagulante, tambin tendr [employer] lo siguiente:  Oceanographer presin sobre las heridas durante ms tiempo que lo habitual.  Informar al dentista y a  otros mdicos que est tomando anticoagulantes, antes de someterse a cualquier procedimiento en el que pudieran producirse hemorragias.  Evite los deportes de contacto.  Use un brazalete de alerta mdico o lleve una tarjeta de alerta mdico que indique que ha tenido EP.  Consulte con su mdico cundo podr retomar sus [employer]. Mantngase activo para Mining engineer formacin de nuevos cogulos de [place].  Asegrese de Public affairs consultant un viaje o cuando haya estado sentado o de pie durante mucho tiempo. Es Recruitment consultant. Ejercite las piernas con caminatas o contracciones y Web designer frecuentes de los msculos de las piernas. [place] con frecuencia.  Use medias de compresin como se lo haya indicado el mdico para evitar la formacin de ms cogulos de [place].  No consuma productos que contengan tabaco, incluidos cigarrillos, tabaco de Higher education careers adviser y Psychologist, sport and exercise. Si necesita ayuda para dejar de fumar, consulte al mdico.  Cumpla con todas las visitas de control, segn le indique su mdico. Esto es importante. PREVENCIN Tome estas medidas para reducir el riesgo de tener otra EP:  Haga ejercicios regularmente. Durante por lo menos 10minutos [employer], [place] lo siguiente:  Actividades que incluyan movimientos de los brazos y las piernas.  Actividades que favorezcan la buena circulacin de la sangre en el cuerpo al incrementar la frecuencia cardaca.  Ejercite los brazos y [place]  durante los viajes de larga distancia (de ms de 4horas). Karma Greaser agua y no consuma alcohol cuando viaja.  Evite permanecer sentado o parado sin moverse durante perodos prolongados.  Mantenga un peso apropiado para su altura. Pregntele al mdico cul es el peso sano para usted.  Si es mujer y es mayor de 35aos, evite el consumo innecesario de medicamentos que contengan estrgeno, incluidos los anticonceptivos.  No fume, especialmente si toma  estrgenos. Si necesita ayuda para dejar de fumar, consulte al [employer].  Si corre un riesgo muy alto de [place] EP, use medias de compresin.  Si tuvo una EP recientemente, debe programar citas para hacerse ecografas de las piernas con regularidad, a fin de Product manager presencia de nuevos cogulos sanguneos. Si est hospitalizado, las medidas de prevencin pueden incluir lo siguiente:  Marily Lente a caminar rpidamente despus de [place], tan pronto como el mdico diga que es seguro [place].  Recibir anticoagulantes para Mining engineer formacin de cogulos de [place]. Si no puede tomar anticoagulantes, puede haber otras opciones disponibles, como usar medias de compresin o diferentes tipos de dispositivos. SOLICITE ATENCIN MDICA DE INMEDIATO SI:  Aumenta el dolor, la hinchazn o el enrojecimiento en un brazo o una pierna; o estos sntomas aparecen.  Tiene adormecimiento u hormigueo en un brazo o una pierna.  Siente que le falta de aire mientras descansa o est en [place].  Siente dolor en el pecho.  Tiene latidos cardacos rpidos o irregulares.  Se siente mareado o siente que va a desvanecerse.  Tose y escupe sangre.  Observa sangre en el vmito, en las heces o en la orina.  Tiene fiebre. Estos sntomas pueden representar un problema grave que constituye Engineer, maintenance (IT). No espere hasta que los sntomas desaparezcan. Solicite atencin mdica de inmediato. Comunquese con el servicio de emergencias de su localidad (911 en los [place]). No conduzca por sus propios medios Principal Financial. Esta informacin no tiene Marine scientist el consejo del mdico. Asegrese de hacerle al mdico cualquier pregunta que tenga. Document Released: 06/14/2005 Document Revised: 12/27/2015 Document Reviewed: 12/30/2014 Elsevier Interactive Patient Education  2017 [place] sea por puncin (Needle Biopsy of the Bone) Una biopsia sea es un procedimiento en el que se toma  una pequea [place] de [place]. La [place] se toma con Maxwell Caul. Luego, la muestra de hueso se examina con un microscopio para Product manager presencia de anomalas. En general, la muestra se toma de un hueso cercano a la piel. Este procedimiento se Optometrist para Product manager presencia de diversos problemas en el hueso. Tal vez deba hacerse este procedimiento si los resultados de los estudios de diagnstico por imgenes o de los anlisis de sangre indicaron que hay un posible problema. Este procedimiento puede realizarse para determinar si un tumor de hueso es canceroso (maligno). Una biopsia de hueso puede ayudar a Human resources officer, por ejemplo:  Tumores de los [employer] (sarcomas) y de la mdula sea (mieloma mltiple).  Formacin anormal del hueso (enfermedad de [place]).  Quistes seos no cancerosos (benignos).  Formaciones seas.  Infecciones seas. INFORME A SU MDICO:  Cualquier alergia que tenga.  Todos los Lyondell Chemical, incluidos vitaminas, hierbas, gotas oftlmicas, cremas y medicamentos de venta libre.  Problemas previos que usted o los UnitedHealth de su familia hayan tenido con el uso de anestsicos.  Enfermedades de la sangre que tenga.  Si tiene cirugas previas.  Cualquier enfermedad que tenga. RIESGOS Y COMPLICACIONES En general, se trata  de un procedimiento seguro. Sin embargo, pueden presentarse problemas, por ejemplo:  Sangrado excesivo.  Infeccin.  Lesiones en los tejidos circundantes. ANTES DEL PROCEDIMIENTO  Consulte a su mdico acerca de estos temas:  Cambiar o suspender los medicamentos que toma habitualmente. Esto es muy importante si toma medicamentos para la diabetes o anticoagulantes.  Tomar medicamentos, como aspirina e ibuprofeno. Estos medicamentos pueden tener un efecto anticoagulante en la [place]. No tome estos medicamentos antes del procedimiento si el mdico le indica que no lo haga.  Siga las indicaciones del mdico respecto de  las restricciones para las comidas o las bebidas.  Haga planes para que una persona lo lleve de vuelta a su casa despus del procedimiento.  Si se va a su casa inmediatamente despus del procedimiento, planifique que alguien se quede con usted durante 24horas. PROCEDIMIENTO  Pueden colocarle una va intravenosa (IV) en una de las venas.  Le limpiarn el lugar donde le aplicarn la inyeccin con una solucin para destruir las bacterias (antisptico).  Podrn administrarle uno o ms de los siguientes medicamentos:  Un medicamento para ayudarlo a Nurse, children's (sedante).  Un medicamento para adormecer la zona (anestesia local).  La muestra de hueso se extraer mediante la insercin de una aguja larga a travs de la piel [place].  Se retirar la aguja.  Se colocar un apsito (vendaje) sobre el sitio de la insercin de la aguja y se pegar con Ferne Reus. Este procedimiento puede variar segn el mdico y el hospital. DESPUS DEL PROCEDIMIENTO  Marin Comment controlarn con frecuencia la presin arterial, la frecuencia cardaca, la frecuencia respiratoria y Retail buyer de oxgeno en la sangre hasta que hayan desaparecido los efectos de los medicamentos administrados.  Reanude sus actividades normales como se lo haya indicado el mdico. Esta informacin no tiene Marine scientist el consejo del mdico. Asegrese de hacerle al mdico cualquier pregunta que tenga. Document Released: 06/25/2013 Document Revised: 05/26/2015 Document Reviewed: 10/12/2014 Elsevier Interactive Patient Education  2017 [place].    Rivaroxaban oral tablets Qu es este medicamento? El RIVAROXABN es un anticoagulante (diluyente de la [place]). Se utiliza para tratar cogulos sanguneos en los pulmones o venas. Se utiliza tambin despus de una operacin de rodilla o cadera para evitar cogulos sanguneos. Se utiliza tambin para reducir la posibilidad de derrame cerebral en los pacientes con una afeccin mdica  llamada fibrilacin auricular. Este medicamento puede ser utilizado para otros usos; si tiene alguna pregunta consulte con su proveedor de atencin mdica o con su farmacutico. MARCAS COMUNES: Xarelto, Xarelto Starter Pack [employer] debo informar a mi profesional de la salud antes de tomar este medicamento? Necesita saber si usted presenta alguno de los siguientes problemas o situaciones: trastornos de sangrado sangrado en el cerebro sangre en las heces (heces de color oscuro o de aspecto alquitranado) o si vomita sangre antecedentes de sangrado estomacal enfermedad renal enfermedad heptica conteos sanguneos bajos, como baja cantidad de glbulos blancos, glbulos rojos y plaquetas procedimiento epidural o vertebral reciente o programado toma medicamentos que tratan o previenen cogulos sanguneos una reaccin alrgica o inusual al rivaroxabn, otros medicamentos, alimentos, colorantes o conservantes si est embarazada o buscando quedar embarazada si est amamantando a un beb Cmo debo utilizar este medicamento? Tome este medicamento por va oral con un vaso de agua. Siga las instrucciones de la etiqueta del [place]. Tome su medicamento a intervalos regulares. No lo tome con una frecuencia mayor a la indicada. No deje de tomarlo, excepto si as lo indica su mdico.  Dejar de tomar Coca-Cola podra aumentar su riesgo de tener un cogulo sanguneo. Asegrese de volver a surtir su receta antes de quedarse sin medicamento. Si est tomando este medicamento despus de [place] de reemplazo de cadera o rodilla, tmelo con o sin alimentos. Si est tomando este medicamento para la fibrilacin auricular, tmelo con su comida de la noche. Si est tomando Coca-Cola para tratar cogulos sanguneos, tmelo con alimentos a la United Technologies Corporation. Si no puede tragar la tableta, puede triturarla y mezclarla con pur de [place]. Coma de inmediato el pur de [place]. Debe comer ms comida  inmediatamente despus de comer el pur de manzana que contiene la tableta triturada. Hable con su pediatra para informarse acerca del uso de este medicamento en nios. Puede requerir atencin especial. Sobredosis: Pngase en contacto inmediatamente con un centro toxicolgico o una sala de urgencia si usted cree que haya tomado demasiado medicamento. ATENCIN: [employer] Foods es solo para usted. No comparta este medicamento con nadie. Qu sucede si me olvido de una dosis? Si toma su medicamento una vez al da y se olvida una dosis, tome la dosis [place] tan pronto como se acuerde. Si toma su [place] y se Charissa Bash dosis, tome la dosis [place] inmediatamente. En este caso, puede tomarse 2 tabletas al [employer]. Al da siguiente debe tomar 1 tableta dos veces al [employer]ands indicado. Qu puede interactuar con este medicamento? No tome este medicamento con ninguno de los siguientes frmacos: defibrotida Este medicamento tambin puede interactuar con los siguientes medicamentos: aspirina y medicamentos tipo aspirina ciertos antibiticos, tales como eritromicina, acitromicina y Ambulance person ciertos medicamentos para infecciones micticas, tales como itraconazol y Photographer ciertos medicamentos para el pulso cardiaco irregular, tales como [place], quinidina, dronedarona ciertos medicamentos para convulsiones, tales como carbamazepina, fenitona ciertos medicamentos que tratan o previenen cogulos sanguneos, como warfarina, enoxaparina y dalteparina conivaptn diltiazem felodipina indinavir lopinavir; ritonavir AINE, medicamentos para el dolor y la inflamacin, como ibuprofeno o naproxeno ranolazina rifampicina ritonavir IRSN, medicamentos para la depresin, tales como desvenlafaxina, duloxetina, levomilnaciprn, venlafaxina ISRS, medicamentos para la depresin, tales como citaloprm, escitaloprm, fluoxetina, fluvoxamina, paroxetina, sertralina hierba de [place]  verapamilo Puede ser que esta lista no menciona todas las posibles interacciones. Informe a su profesional de KB Home	[place] de [employer] Corporation productos a base de hierbas, medicamentos de [place] o suplementos nutritivos que est tomando. Si usted fuma, consume bebidas alcohlicas o si utiliza drogas ilegales, indqueselo tambin a su profesional de KB Home	[place]. Algunas sustancias pueden interactuar con su medicamento. A qu debo estar atento al usar Coca-Cola? Visite a su mdico o a su profesional de la salud para revisar regularmente su evolucin. Notifique a su mdico o a su profesional de la salud y busque tratamiento mdico de emergencia si desarrolla problemas respiratorios; cambios en la visin; dolor en el pecho; dolor de cabeza repentino e intenso; dolor, hinchazn, calor en la pierna; problemas para hablar; entumecimiento o debilidad repentina en el rostro, el brazo o la pierna. Estos pueden ser signos de que su afeccin ha [employer]. Si va a someterse a una operacin u otro procedimiento, informe a su mdico que est tomando Coca-Cola. Qu efectos secundarios puedo tener al Masco Corporation este medicamento? Efectos secundarios que debe informar a su mdico o a Barrister's clerk de la salud tan pronto como sea posible: Chief of Staff, como erupcin cutnea, picazn o urticarias, e hinchazn de la cara, los labios o la lengua dolor de espalda enrojecimiento,  formacin de ampollas, descamacin o distensin de la piel, incluso dentro de la boca signos y sntomas de Teacher, music, tales como heces con sangre o de color negro y aspecto alquitranado; [place] de color rojo o Forensic psychologist oscuro; escupir sangre o Sales executive que tiene el aspecto de granos de caf molido; Tree surgeon rojas en la piel; sangrado o moretones inusuales en los ojos, las encas o la nariz Efectos secundarios que generalmente no requieren atencin mdica (infrmelos a su mdico o a Barrister's clerk de la salud si persisten o si son  molestos): Leisure centre manager Puede ser que esta lista no menciona todos los posibles efectos secundarios. Comunquese a su mdico por asesoramiento mdico Humana Inc. Usted puede informar los efectos secundarios a la FDA por telfono al 1-800-FDA-1088. Dnde debo guardar mi medicina? Mantngala fuera del alcance de los nios. [place]a a [employer]PL [employer], entre 15 y 31 grados C (28 y 73 grados F). Deseche todo el medicamento que no haya utilizado, despus de la fecha de vencimiento. ATENCIN: Este folleto es un resumen. Puede ser que no cubra toda la posible informacin. Si usted tiene preguntas acerca de esta medicina, consulte con su mdico, su farmacutico o su profesional de Technical sales engineer.  2018 Elsevier/Gold Standard (2016-10-05 00:00:00)

## 2017-02-10 LAB — COMPLETE METABOLIC PANEL WITH GFR
ALT: 46 U/L (ref 9–46)
AST: 35 U/L (ref 10–35)
Albumin: 3.8 g/dL (ref 3.6–5.1)
Alkaline Phosphatase: 194 U/L — ABNORMAL HIGH (ref 40–115)
BUN: 16 mg/dL (ref 7–25)
CHLORIDE: 99 mmol/L (ref 98–110)
CO2: 22 mmol/L (ref 20–31)
Calcium: 9.2 mg/dL (ref 8.6–10.3)
Creat: 0.74 mg/dL (ref 0.70–1.33)
GFR, Est African American: >89 mL/min (ref >=60)
GLUCOSE: 125 mg/dL — AB (ref 65–99)
POTASSIUM: 4.5 mmol/L (ref 3.5–5.3)
SODIUM: 133 mmol/L — AB (ref 135–146)
Total Bilirubin: 0.5 mg/dL (ref 0.2–1.2)
Total Protein: 6.7 g/dL (ref 6.1–8.1)

## 2017-02-10 LAB — PSA: PSA: 0.2 ng/mL (ref <=4.0)

## 2017-02-13 ENCOUNTER — Other Ambulatory Visit: Payer: Self-pay | Admitting: Hematology

## 2017-02-13 ENCOUNTER — Encounter: Payer: Self-pay | Admitting: Hematology

## 2017-02-13 ENCOUNTER — Telehealth: Payer: Self-pay | Admitting: Hematology

## 2017-02-13 DIAGNOSIS — M899 Disorder of bone, unspecified: Secondary | ICD-10-CM

## 2017-02-13 NOTE — Telephone Encounter (Signed)
Appt has been scheduled for the pt to see Dr. Burr Medico on 6/11 at 915am. Appt was scheduled w/the pt's daughter who says she will accompany the pt to the appt bc he doesn't speak good [place]. Letter mailed.

## 2017-02-16 ENCOUNTER — Telehealth: Payer: Self-pay

## 2017-02-16 MED ORDER — OXYCODONE-ACETAMINOPHEN 5-325 MG PO TABS: 1.0000 | ORAL_TABLET | Freq: Four times a day (QID) | ORAL | 0 refills | Status: DC | PRN

## 2017-02-16 MED ORDER — OXYCODONE-ACETAMINOPHEN 5-325 MG PO TABS: 1.0000 | ORAL_TABLET | Freq: Four times a day (QID) | ORAL | 0 refills | Status: AC | PRN

## 2017-02-16 NOTE — Telephone Encounter (Signed)
Pain contract obtained

## 2017-02-19 ENCOUNTER — Other Ambulatory Visit: Payer: Self-pay | Admitting: Student

## 2017-02-20 ENCOUNTER — Encounter (HOSPITAL_COMMUNITY): Payer: Self-pay

## 2017-02-20 ENCOUNTER — Observation Stay (HOSPITAL_COMMUNITY)
Admission: AD | Admit: 2017-02-20 | Discharge: 2017-02-21 | Disposition: A | Discharge status: Home / Self Care | Payer: Self-pay | Source: Ambulatory Visit | Attending: Internal Medicine | Admitting: Internal Medicine

## 2017-02-20 ENCOUNTER — Other Ambulatory Visit: Payer: Self-pay | Admitting: Hematology

## 2017-02-20 ENCOUNTER — Ambulatory Visit (HOSPITAL_COMMUNITY)
Admission: RE | Admit: 2017-02-20 | Discharge: 2017-02-20 | Disposition: A | Discharge status: Home / Self Care | Payer: Self-pay | Source: Ambulatory Visit | Attending: Hematology | Admitting: Hematology

## 2017-02-20 DIAGNOSIS — M25511 Pain in right shoulder: Secondary | ICD-10-CM | POA: Insufficient documentation

## 2017-02-20 DIAGNOSIS — D61818 Other pancytopenia: Secondary | ICD-10-CM | POA: Insufficient documentation

## 2017-02-20 DIAGNOSIS — Z87891 Personal history of nicotine dependence: Secondary | ICD-10-CM | POA: Insufficient documentation

## 2017-02-20 DIAGNOSIS — Z7901 Long term (current) use of anticoagulants: Secondary | ICD-10-CM

## 2017-02-20 DIAGNOSIS — C9000 Multiple myeloma not having achieved remission: Secondary | ICD-10-CM

## 2017-02-20 DIAGNOSIS — C91Z0 Other lymphoid leukemia not having achieved remission: Secondary | ICD-10-CM

## 2017-02-20 DIAGNOSIS — C9100 Acute lymphoblastic leukemia not having achieved remission: Secondary | ICD-10-CM | POA: Insufficient documentation

## 2017-02-20 DIAGNOSIS — Z86711 Personal history of pulmonary embolism: Secondary | ICD-10-CM | POA: Insufficient documentation

## 2017-02-20 DIAGNOSIS — I2699 Other pulmonary embolism without acute cor pulmonale: Secondary | ICD-10-CM | POA: Diagnosis present

## 2017-02-20 DIAGNOSIS — D696 Thrombocytopenia, unspecified: Secondary | ICD-10-CM | POA: Insufficient documentation

## 2017-02-20 DIAGNOSIS — M899 Disorder of bone, unspecified: Secondary | ICD-10-CM | POA: Insufficient documentation

## 2017-02-20 DIAGNOSIS — Z79899 Other long term (current) drug therapy: Secondary | ICD-10-CM | POA: Insufficient documentation

## 2017-02-20 DIAGNOSIS — R042 Hemoptysis: Secondary | ICD-10-CM | POA: Insufficient documentation

## 2017-02-20 DIAGNOSIS — J90 Pleural effusion, not elsewhere classified: Secondary | ICD-10-CM | POA: Insufficient documentation

## 2017-02-20 DIAGNOSIS — I10 Essential (primary) hypertension: Secondary | ICD-10-CM | POA: Insufficient documentation

## 2017-02-20 DIAGNOSIS — M549 Dorsalgia, unspecified: Secondary | ICD-10-CM | POA: Insufficient documentation

## 2017-02-20 HISTORY — DX: Tobacco use: Z72.0

## 2017-02-20 HISTORY — DX: Pleural effusion, not elsewhere classified: J90

## 2017-02-20 HISTORY — DX: Cellulitis, unspecified: L03.90

## 2017-02-20 HISTORY — DX: Palpitations: R00.2

## 2017-02-20 HISTORY — DX: Methicillin resistant Staphylococcus aureus infection, unspecified site: A49.02

## 2017-02-20 HISTORY — DX: Dyspnea, unspecified: R06.00

## 2017-02-20 HISTORY — DX: Sepsis, unspecified organism: A41.9

## 2017-02-20 HISTORY — DX: Anesthesia of skin: R20.0

## 2017-02-20 HISTORY — DX: Dorsalgia, unspecified: M54.9

## 2017-02-20 LAB — CBC WITH DIFFERENTIAL/PLATELET
Basophils Absolute: 0 10*3/uL (ref 0.0–0.1)
Basophils Relative: 1 %
Eosinophils Absolute: 0 10*3/uL (ref 0.0–0.7)
Eosinophils Relative: 2 %
HCT: 27.5 % — ABNORMAL LOW (ref 39.0–52.0)
Hemoglobin: 9.7 g/dL — ABNORMAL LOW (ref 13.0–17.0)
LYMPHS PCT: 61 %
Lymphs Abs: 1.2 10*3/uL (ref 0.7–4.0)
MCH: 29.2 pg (ref 26.0–34.0)
MCHC: 35.3 g/dL (ref 30.0–36.0)
MCV: 82.8 fL (ref 78.0–100.0)
Monocytes Absolute: 0.1 10*3/uL (ref 0.1–1.0)
Monocytes Relative: 8 %
NRBC: 1 /100{WBCs} — AB
Neutro Abs: 0.5 10*3/uL — ABNORMAL LOW (ref 1.7–7.7)
Neutrophils Relative %: 28 %
RBC: 3.32 MIL/uL — ABNORMAL LOW (ref 4.22–5.81)
RDW: 12.9 % (ref 11.5–15.5)
WBC: 1.8 10*3/uL — ABNORMAL LOW (ref 4.0–10.5)

## 2017-02-20 LAB — COMPREHENSIVE METABOLIC PANEL
ALT: 29 U/L (ref 17–63)
AST: 43 U/L — AB (ref 15–41)
Albumin: 3.1 g/dL — ABNORMAL LOW (ref 3.5–5.0)
Alkaline Phosphatase: 146 U/L — ABNORMAL HIGH (ref 38–126)
Anion gap: 7 (ref 5–15)
BUN: 14 mg/dL (ref 6–20)
CHLORIDE: 103 mmol/L (ref 101–111)
CO2: 25 mmol/L (ref 22–32)
Calcium: 8.3 mg/dL — ABNORMAL LOW (ref 8.9–10.3)
Creatinine, Ser: 0.65 mg/dL (ref 0.61–1.24)
Glucose, Bld: 111 mg/dL — ABNORMAL HIGH (ref 65–99)
POTASSIUM: 3.9 mmol/L (ref 3.5–5.1)
Sodium: 135 mmol/L (ref 135–145)
TOTAL PROTEIN: 6.3 g/dL — AB (ref 6.5–8.1)
Total Bilirubin: 0.4 mg/dL (ref 0.3–1.2)

## 2017-02-20 LAB — DIC (DISSEMINATED INTRAVASCULAR COAGULATION)PANEL
INR: 1.16
Prothrombin Time: 14.9 seconds (ref 11.4–15.2)

## 2017-02-20 LAB — CBC
HCT: 28.5 % — ABNORMAL LOW (ref 39.0–52.0)
HEMATOCRIT: 27.6 % — AB (ref 39.0–52.0)
HEMOGLOBIN: 10.2 g/dL — AB (ref 13.0–17.0)
HEMOGLOBIN: 9.9 g/dL — AB (ref 13.0–17.0)
MCH: 29.8 pg (ref 26.0–34.0)
MCH: 30 pg (ref 26.0–34.0)
MCHC: 35.8 g/dL (ref 30.0–36.0)
MCHC: 35.9 g/dL (ref 30.0–36.0)
MCV: 83.3 fL (ref 78.0–100.0)
MCV: 83.6 fL (ref 78.0–100.0)
Platelets: <5 10*3/uL — CL (ref 150–400)
Platelets: 11 10*3/uL — CL (ref 150–400)
RBC: 3.42 MIL/uL — ABNORMAL LOW (ref 4.22–5.81)
RBC: 3.3 MIL/uL — AB (ref 4.22–5.81)
RDW: 13 % (ref 11.5–15.5)
RDW: 13 % (ref 11.5–15.5)
WBC: 2 10*3/uL — ABNORMAL LOW (ref 4.0–10.5)
WBC: 2.4 10*3/uL — ABNORMAL LOW (ref 4.0–10.5)

## 2017-02-20 LAB — DIC (DISSEMINATED INTRAVASCULAR COAGULATION) PANEL
APTT: 47 s — AB (ref 24–36)
FIBRINOGEN: 490 mg/dL — AB (ref 210–475)
SMEAR REVIEW: NONE SEEN

## 2017-02-20 LAB — PROTIME-INR
INR: 1.17
Prothrombin Time: 15 seconds (ref 11.4–15.2)

## 2017-02-20 LAB — RETICULOCYTES
RBC.: 3.32 MIL/uL — ABNORMAL LOW (ref 4.22–5.81)
RETIC COUNT ABSOLUTE: 19.9 10*3/uL (ref 19.0–186.0)
Retic Ct Pct: 0.6 % (ref 0.4–3.1)

## 2017-02-20 LAB — ABO/RH: ABO/RH(D): O POS

## 2017-02-20 LAB — OCCULT BLOOD X 1 CARD TO LAB, STOOL: FECAL OCCULT BLD: NEGATIVE

## 2017-02-20 LAB — TYPE AND SCREEN
ABO/RH(D): O POS
Antibody Screen: NEGATIVE

## 2017-02-20 LAB — LACTATE DEHYDROGENASE: LDH: 1751 U/L — ABNORMAL HIGH (ref 98–192)

## 2017-02-20 LAB — APTT: APTT: 47 s — AB (ref 24–36)

## 2017-02-20 MED ORDER — HYDROCODONE-ACETAMINOPHEN 5-325 MG PO TABS
1.0000 | ORAL_TABLET | Freq: Four times a day (QID) | ORAL | Status: DC | PRN
  Filled 2017-02-20: qty 1

## 2017-02-20 MED ORDER — SODIUM CHLORIDE 0.9% FLUSH
3.0000 mL | Freq: Two times a day (BID) | INTRAVENOUS | Status: DC
  Administered 2017-02-20: 3 mL via INTRAVENOUS

## 2017-02-20 MED ORDER — DOCUSATE SODIUM 100 MG PO CAPS
100.0000 mg | ORAL_CAPSULE | Freq: Two times a day (BID) | ORAL | Status: DC
  Administered 2017-02-20: 100 mg via ORAL
  Filled 2017-02-20: qty 1

## 2017-02-20 MED ORDER — ONDANSETRON HCL 4 MG PO TABS
4.0000 mg | ORAL_TABLET | Freq: Four times a day (QID) | ORAL | Status: DC | PRN
Start: 2017-02-20 — End: 2017-02-21

## 2017-02-20 MED ORDER — SODIUM CHLORIDE 0.9 % IV SOLN
Freq: Once | INTRAVENOUS | Status: AC
  Administered 2017-02-20: 12:00:00 via INTRAVENOUS

## 2017-02-20 MED ORDER — SODIUM CHLORIDE 0.9% FLUSH: 3.0000 mL | INTRAVENOUS | Status: DC | PRN

## 2017-02-20 MED ORDER — SODIUM CHLORIDE 0.9 % IV SOLN: Freq: Once | INTRAVENOUS | Status: DC

## 2017-02-20 MED ORDER — FENTANYL CITRATE (PF) 100 MCG/2ML IJ SOLN
INTRAMUSCULAR | Status: AC
  Filled 2017-02-20: qty 4

## 2017-02-20 MED ORDER — BISACODYL 5 MG PO TBEC: 5.0000 mg | DELAYED_RELEASE_TABLET | Freq: Every day | ORAL | Status: DC | PRN

## 2017-02-20 MED ORDER — ONDANSETRON HCL 4 MG/2ML IJ SOLN: 4.0000 mg | Freq: Four times a day (QID) | INTRAMUSCULAR | Status: DC | PRN

## 2017-02-20 MED ORDER — MAGNESIUM CITRATE PO SOLN: 1.0000 | Freq: Once | ORAL | Status: DC | PRN

## 2017-02-20 MED ORDER — SENNA 8.6 MG PO TABS
2.0000 | ORAL_TABLET | Freq: Every day | ORAL | Status: DC
  Administered 2017-02-20: 17.2 mg via ORAL
  Filled 2017-02-20: qty 2

## 2017-02-20 MED ORDER — ENSURE ENLIVE PO LIQD
237.0000 mL | Freq: Two times a day (BID) | ORAL | Status: DC
  Administered 2017-02-21: 237 mL via ORAL

## 2017-02-20 MED ORDER — ACETAMINOPHEN 325 MG PO TABS
650.0000 mg | ORAL_TABLET | Freq: Four times a day (QID) | ORAL | Status: DC | PRN
Start: 2017-02-20 — End: 2017-02-21

## 2017-02-20 MED ORDER — OXYCODONE-ACETAMINOPHEN 5-325 MG PO TABS
1.0000 | ORAL_TABLET | Freq: Four times a day (QID) | ORAL | Status: DC | PRN
  Administered 2017-02-20 – 2017-02-21 (×3): 2 via ORAL
  Administered 2017-02-21: 1 via ORAL
  Filled 2017-02-20: qty 1
  Filled 2017-02-20: qty 2
  Filled 2017-02-20: qty 1
  Filled 2017-02-20 (×2): qty 2

## 2017-02-20 MED ORDER — ACETAMINOPHEN 650 MG RE SUPP: 650.0000 mg | Freq: Four times a day (QID) | RECTAL | Status: DC | PRN

## 2017-02-20 MED ORDER — POLYETHYLENE GLYCOL 3350 17 G PO PACK: 17.0000 g | PACK | Freq: Every day | ORAL | Status: DC | PRN

## 2017-02-20 MED ORDER — SODIUM CHLORIDE 0.9 % IV SOLN
INTRAVENOUS | Status: DC
  Administered 2017-02-20: 07:00:00 via INTRAVENOUS

## 2017-02-20 MED ORDER — FLUMAZENIL 0.5 MG/5ML IV SOLN
INTRAVENOUS | Status: AC
  Filled 2017-02-20: qty 5

## 2017-02-20 MED ORDER — OXYCODONE HCL 5 MG PO TABS
10.0000 mg | ORAL_TABLET | Freq: Once | ORAL | Status: AC
  Administered 2017-02-20: 10 mg via ORAL
  Filled 2017-02-20: qty 2

## 2017-02-20 MED ORDER — MIDAZOLAM HCL 2 MG/2ML IJ SOLN
INTRAMUSCULAR | Status: AC
  Filled 2017-02-20: qty 4

## 2017-02-20 MED ORDER — AMLODIPINE BESYLATE 5 MG PO TABS
5.0000 mg | ORAL_TABLET | Freq: Every day | ORAL | Status: DC
  Administered 2017-02-20 – 2017-02-21 (×2): 5 mg via ORAL
  Filled 2017-02-20 (×2): qty 1

## 2017-02-20 MED ORDER — NALOXONE HCL 0.4 MG/ML IJ SOLN
INTRAMUSCULAR | Status: AC
  Filled 2017-02-20: qty 1

## 2017-02-20 MED ORDER — FENTANYL CITRATE (PF) 100 MCG/2ML IJ SOLN
INTRAMUSCULAR | Status: AC | PRN
  Administered 2017-02-20 (×2): 50 ug via INTRAVENOUS

## 2017-02-20 MED ORDER — SODIUM CHLORIDE 0.9 % IV SOLN
250.0000 mL | INTRAVENOUS | Status: DC | PRN
  Administered 2017-02-20: 250 mL via INTRAVENOUS

## 2017-02-20 MED ORDER — MIDAZOLAM HCL 2 MG/2ML IJ SOLN
INTRAMUSCULAR | Status: AC | PRN
  Administered 2017-02-20 (×4): 1 mg via INTRAVENOUS

## 2017-02-20 MED ORDER — POLYETHYLENE GLYCOL 3350 17 G PO PACK
17.0000 g | PACK | Freq: Every day | ORAL | Status: DC
  Administered 2017-02-20 – 2017-02-21 (×2): 17 g via ORAL
  Filled 2017-02-20 (×2): qty 1

## 2017-02-20 NOTE — Progress Notes (Signed)
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

## 2017-02-20 NOTE — Procedures (Signed)
Pre-procedure Diagnosis: Concern for multiple myeloma Post-procedure Diagnosis: Same  Technically successful CT guided bone marrow aspiration and biopsy of left iliac crest.   Complications: None Immediate  EBL: None  SignedSandi Mariscal Pager: [phone] 02/20/2017, 10:03 AM

## 2017-02-20 NOTE — H&P (Addendum)
History and Physical    Guy Walker YPP:[id number] DOB: 1960-11-15 DOA: 02/20/2017  Referring MD/NP/PA: Dr. Truitt Merle PCP: Scot Jun, FNP   Patient coming from: home, referred from [place] by Dr. Burr Medico   Chief Complaint: thrombocytopenia, pulmonary embolism  HPI: Guy Walker is a Spanish-speaking 56 y.o. male who had been healthy other than some smoking and possibly some previously undiagnosed hypertension until mid May of this year. He developed chest pain and shoulder pain and hemoptysis and was admitted to the hospital where he was found to have a right sided pulmonary embolism. He also had suspicious vertebral lesions concerning for metastatic cancer or multiple myeloma. He was seen by oncology and referred for an outpatient bone marrow biopsy. He was started on Xarelto.  He presented today for his bone marrow biopsy and routine labs demonstrated that his platelets had dropped from 141 on 5/25 to undetectable levels today. The lab was repeated the checked and was consistent. He is also developed leukopenia and progressive anemia. W BC on 5/25 was 6.3, currently 1.8 with 500 neutrophils, hemoglobin previously 14 and now down to 9.7 g/dL.  After completion of his bone marrow biopsy, he was to have an appointment with Dr. Burr Medico and was referred to the [facility] for further evaluation of his thrombocytopenia, platelet transfusion.    Patient states that he was having some right shoulder and back pain when he was diagnosed with his pulmonary embolism but that is since improved. Over the last few days he has had worsening left posterior shoulder pain and particularly left hip pain that is 7-10/10, worse with movement or pressure. He has been using oxycodone which brings his pain score down to a 4 out of 10. He denies radiation.  Review of Systems:  General:  Denies fevers, positive chills, weight change HEENT:  Denies changes to hearing and vision, sinus  congestion, sore throat CV:  Denies chest pain, palpitations, lower extremity edema  PULM:  Positive SOB this morning with mild cough.   GI:  Denies nausea, vomiting, constipated recently secondary to pain medication GU:  Denies dysuria, frequency, urgency ENDO:  Denies polyuria, polydipsia.   HEME:  Denies blood in stools, abnormal bruising or bleeding.  LYMPH:  Denies lymphadenopathy.   MSK:  Severe arthralgias particularly in the left shoulder and in the left hip 7 out of 10, denies myalgias.   DERM:  Denies skin rash or ulcer.   NEURO:  Denies new focal numbness or weakness, slurred speech, confusion.  Full body weakness PSYCH:  Denies anxiety and depression.    Past Medical History:  Diagnosis Date  . Back pain   . Cellulitis   . Dyspnea    with increased exertion   . Heart palpitations   . Hypertension   . MRSA (methicillin resistant Staphylococcus aureus)   . Numbness in both legs   . Pleural effusion on right   . Pulmonary embolism ([place])   . Sepsis ([place])   . Tobacco use     Past Surgical History:  Procedure Laterality Date  . INCISION AND DRAINAGE ABSCESS N/A 10/30/2013   Procedure: INCISION AND DRAINAGE ABSCESS LEFT INNER GROIN;  Surgeon: Pedro Earls, MD;  Location: [facility];  Service: General;  Laterality: N/A;  . Nodules removed from throat  1970's     reports that he has quit smoking. His smoking use included Cigarettes. He smoked 0.25 packs per day. He has never used smokeless tobacco. He reports that he does not  drink alcohol or use drugs.  No Known Allergies  Family History  Problem Relation Age of Onset  . Hypertension Mother   . Heart failure Father     Prior to Admission medications   Medication Sig Start Date End Date Taking? Authorizing Provider  amLODipine (NORVASC) 5 MG tablet Take 1 tablet (5 mg total) by mouth daily. 02/09/17  Yes Scot Jun, FNP  docusate sodium (COLACE) 100 MG capsule Take 1 capsule (100 mg total) by mouth every 12  (twelve) hours. 02/08/17  Yes Charlesetta Shanks, MD  HYDROcodone-acetaminophen (NORCO) 5-325 MG tablet Take 1 tablet by mouth every 6 (six) hours as needed for moderate pain. 02/05/17  Yes Florencia Reasons, MD  oxyCODONE-acetaminophen (PERCOCET) 5-325 MG tablet Take 1-2 tablets by mouth every 6 (six) hours as needed for moderate pain or severe pain. Do not refill after 03/03/2017 02/16/17 03/03/17 Yes Scot Jun, FNP  polyethylene glycol [facility] / GLYCOLAX) packet Take 17 g by mouth daily. 02/08/17  Yes Charlesetta Shanks, MD    Physical Exam: Vitals:   02/20/17 1143  BP: 133/80  Pulse: 84  Resp: 18  Temp: 98.5 F (36.9 C)  TempSrc: Oral  SpO2: 98%  Weight: 88.5 kg (195 lb)  Height: 5' 8"  (1.727 m)    Constitutional:  NAD, calm, comfortable Eyes: PERRL, lids and conjunctivae normal ENMT:  Moist mucous membranes.  Oropharynx nonerythematous, no exudates.   Neck:  No nuchal rigidity, no masses Respiratory:  No wheezes, rales, or rhonchi Cardiovascular: Regular rate and rhythm, no murmurs / rubs / gallops.  2+ radial pulses. Abdomen:  Normal active bowel sounds, soft, nondistended, nontender Musculoskeletal: Normal muscle tone and bulk.  No contractures.  Skin:  no abrasions, or ulcers.  Dressing from bone marrow biopsy on left flank is clean, dry, intact. He has a few rare scattered petechiae on bilateral ankles Neurologic:  CN 2-12 grossly intact. Sensation intact to light touch, strength 5/5 throughout Psychiatric:  Alert and oriented x 3. Normal affect.   Labs on Admission: I have personally reviewed following labs and imaging studies  CBC:  Recent Labs Lab 02/20/17 0716 02/20/17 0839  WBC 2.0* 1.8*  NEUTROABS  --  0.5*  HGB 10.2* 9.7*  HCT 28.5* 27.5*  MCV 83.3 82.8  PLT <5* <5*  <5*   Basic Metabolic Panel: No results for input(s): NA, K, CL, CO2, GLUCOSE, BUN, CREATININE, CALCIUM, MG, PHOS in the last 168 hours. GFR: Estimated Creatinine Clearance: 115.4 mL/min (by C-G  formula based on SCr of 0.74 mg/dL). Liver Function Tests: No results for input(s): AST, ALT, ALKPHOS, BILITOT, PROT, ALBUMIN in the last 168 hours. No results for input(s): LIPASE, AMYLASE in the last 168 hours. No results for input(s): AMMONIA in the last 168 hours. Coagulation Profile:  Recent Labs Lab 02/20/17 0716 02/20/17 0839  INR 1.17 1.16   Cardiac Enzymes: No results for input(s): CKTOTAL, CKMB, CKMBINDEX, TROPONINI in the last 168 hours. BNP (last 3 results) No results for input(s): PROBNP in the last 8760 hours. HbA1C: No results for input(s): HGBA1C in the last 72 hours. CBG: No results for input(s): GLUCAP in the last 168 hours. Lipid Profile: No results for input(s): CHOL, HDL, LDLCALC, TRIG, CHOLHDL, LDLDIRECT in the last 72 hours. Thyroid Function Tests: No results for input(s): TSH, T4TOTAL, FREET4, T3FREE, THYROIDAB in the last 72 hours. Anemia Panel:  Recent Labs  02/20/17 0839  RETICCTPCT 0.6   Urine analysis:    Component Value Date/Time   COLORURINE  YELLOW 02/08/2017 0828   APPEARANCEUR CLEAR 02/08/2017 0828   LABSPEC 1.025 02/09/2017 1221   PHURINE 6.0 02/09/2017 1221   GLUCOSEU NEGATIVE 02/09/2017 1221   HGBUR SMALL (A) 02/09/2017 1221   BILIRUBINUR NEGATIVE 02/09/2017 1221   KETONESUR NEGATIVE 02/09/2017 1221   PROTEINUR 30 (A) 02/09/2017 1221   UROBILINOGEN 2.0 (H) 02/09/2017 1221   NITRITE NEGATIVE 02/09/2017 1221   LEUKOCYTESUR NEGATIVE 02/09/2017 1221   Sepsis Labs: '@LABRCNTIP'$ (XNATFTDDUKGUR:4,YHCWCBJSEGB:1) )No results found for this or any previous visit (from the past 240 hour(s)).   Radiological Exams on Admission: Ct Biopsy  Result Date: 02/20/2017 INDICATION: Concern for multiple myeloma. Please perform CT-guided bone marrow biopsy for tissue diagnostic purposes. EXAM: CT-GUIDED BONE MARROW BIOPSY AND ASPIRATION MEDICATIONS: None ANESTHESIA/SEDATION: Fentanyl 100 mcg IV; Versed 4 mg IV Sedation Time: 14 minutes; The patient was  continuously monitored during the procedure by the interventional radiology nurse under my direct supervision. COMPLICATIONS: None immediate. PROCEDURE: Informed consent was obtained from the patient following an explanation of the procedure, risks, benefits and alternatives. The patient understands, agrees and consents for the procedure. All questions were addressed. A time out was performed prior to the initiation of the procedure. The patient was positioned prone and non-contrast localization CT was performed of the pelvis to demonstrate the iliac marrow spaces. The operative site was prepped and draped in the usual sterile fashion. Under sterile conditions and local anesthesia, a 22 gauge spinal needle was utilized for procedural planning. Next, an 11 gauge coaxial bone biopsy needle was advanced into the left iliac marrow space. Needle position was confirmed with CT imaging. Initially, bone marrow aspiration was performed. Next, a bone marrow biopsy was obtained with the 11 gauge outer bone marrow device. The 11 gauge coaxial bone biopsy needle was re-advanced into a slightly different location within the left iliac marrow space, positioning was confirmed and an additional bone marrow biopsy was obtained. Samples were prepared with the cytotechnologist and deemed adequate. The needle was removed intact. Hemostasis was obtained with compression and a dressing was placed. The patient tolerated the procedure well without immediate post procedural complication. IMPRESSION: Successful CT guided left iliac bone marrow aspiration and core biopsy. Electronically Signed   By: Sandi Mariscal M.D.   On: 02/20/2017 11:19   Ct Bone Marrow Biopsy  Result Date: 02/20/2017 INDICATION: Concern for multiple myeloma. Please perform CT-guided bone marrow biopsy for tissue diagnostic purposes. EXAM: CT-GUIDED BONE MARROW BIOPSY AND ASPIRATION MEDICATIONS: None ANESTHESIA/SEDATION: Fentanyl 100 mcg IV; Versed 4 mg IV Sedation Time:  14 minutes; The patient was continuously monitored during the procedure by the interventional radiology nurse under my direct supervision. COMPLICATIONS: None immediate. PROCEDURE: Informed consent was obtained from the patient following an explanation of the procedure, risks, benefits and alternatives. The patient understands, agrees and consents for the procedure. All questions were addressed. A time out was performed prior to the initiation of the procedure. The patient was positioned prone and non-contrast localization CT was performed of the pelvis to demonstrate the iliac marrow spaces. The operative site was prepped and draped in the usual sterile fashion. Under sterile conditions and local anesthesia, a 22 gauge spinal needle was utilized for procedural planning. Next, an 11 gauge coaxial bone biopsy needle was advanced into the left iliac marrow space. Needle position was confirmed with CT imaging. Initially, bone marrow aspiration was performed. Next, a bone marrow biopsy was obtained with the 11 gauge outer bone marrow device. The 11 gauge coaxial bone biopsy needle was re-advanced  into a slightly different location within the left iliac marrow space, positioning was confirmed and an additional bone marrow biopsy was obtained. Samples were prepared with the cytotechnologist and deemed adequate. The needle was removed intact. Hemostasis was obtained with compression and a dressing was placed. The patient tolerated the procedure well without immediate post procedural complication. IMPRESSION: Successful CT guided left iliac bone marrow aspiration and core biopsy. Electronically Signed   By: Sandi Mariscal M.D.   On: 02/20/2017 11:19    Assessment/Plan Active Problems:   Thrombocytopenia (HCC)   Pancytopenia, with severe thrombocytopenia. His LDH and d-dimer very elevated but he did not have schistocytes on smear according to his oncologist so DIC/TTP/HUS less likely.  She does not think that he has a  hemolytic process and believes his pancytopenia is due to his malignancy.  She has o already ordered some blood work to evaluate.   -  Hit panel, although his last heparin exposure was more than 10 days ago -  Transfuse 1 unit platelets  -  Posttransfusion platelet level   Normocytic anemia, manual retic 19.9, 0.6%, likely marrow failure -  TSH, folate, b12, iron, occult stool  Cancer -  Dr. Burr Medico should have preliminary report on bone marrow biopsy done this morning soon  Severe bone pain and narcotic induced constipation -  Continue vicodin/oxycodone for pain control -  miralax with senna scheduled -  Bisacodyl prn  Pulmonary embolism -  D/c anticoagulation due to thrombocytopenia  Essential hypertension, blood pressure elevated -  Continue norvasc  DVT prophylaxis:  SCDs/thrombocytopenia  Code Status: full Family Communication:  Patient, wife, and daughter.  Used interpreter, but daughter also speaks [place] fluently Disposition Plan:  Likely home in 2 days  Consults called: Oncology  Admission status: observation, med-surg.  At risk for decompensation secondary to bone marrow failure, severe thrombocytopenia in the setting of pulmonary embolism. Unable to anticoagulate.  Janece Canterbury MD [facility] Pager [phone]  If 7PM-7AM, please contact night-coverage [email or web address] Password [facility]  02/20/2017, 12:33 PM

## 2017-02-20 NOTE — Progress Notes (Signed)
Artist Camacho-Alcantara   DOB:1961-06-22   OA#:[id number]   SWF#:[id number]  Oncology follow up note   Subjective: Patient came in today for bone marrow biopsy as outpatient, CBC showed pancytopenia, with PLT <5K, no active bleeding or petechia. No fever or chills. Patient complains of bilateral shoulder pain. He was admitted to the hospital for further management.    Objective:  Vitals:   02/20/17 1540 02/20/17 1718  BP: 137/78 137/80  Pulse: 79 79  Resp: 18 20  Temp: 99.1 F (37.3 C) 99.1 F (37.3 C)    Body mass index is 29.65 kg/m.  Intake/Output Summary (Last 24 hours) at 02/20/17 1956 Last data filed at 02/20/17 1718  Gross per 24 hour  Intake              320 ml  Output                0 ml  Net              320 ml     Sclerae unicteric  Skin: No rash or petechia  Oropharynx clear, no mucosal bleeding  No peripheral adenopathy  Lungs clear -- no rales or rhonchi  Heart regular rate and rhythm  Abdomen benign  MSK no focal spinal tenderness, no peripheral edema  Neuro nonfocal   CBG (last 3)  No results for input(s): GLUCAP in the last 72 hours.   Labs:  Lab Results  Component Value Date   WBC 2.4 (L) 02/20/2017   HGB 9.9 (L) 02/20/2017   HCT 27.6 (L) 02/20/2017   MCV 83.6 02/20/2017   PLT 11 (LL) 02/20/2017   NEUTROABS 0.5 (L) 02/20/2017   CMP Latest Ref Rng & Units 02/20/2017 02/09/2017 02/08/2017  Glucose 65 - 99 mg/dL 111(H) 125(H) 131(H)  BUN 6 - 20 mg/dL 14 16 20   Creatinine 0.61 - 1.24 mg/dL 0.65 0.74 1.01  Sodium 135 - 145 mmol/L 135 133(L) 136  Potassium 3.5 - 5.1 mmol/L 3.9 4.5 4.4  Chloride 101 - 111 mmol/L 103 99 104  CO2 22 - 32 mmol/L 25 22 23   Calcium 8.9 - 10.3 mg/dL 8.3(L) 9.2 9.3  Total Protein 6.5 - 8.1 g/dL 6.3(L) 6.7 7.4  Total Bilirubin 0.3 - 1.2 mg/dL 0.4 0.5 0.5  Alkaline Phos 38 - 126 U/L 146(H) 194(H) 215(H)  AST 15 - 41 U/L 43(H) 35 191(H)  ALT 17 - 63 U/L 29 46 72(H)    Urine Studies No results for input(s): UHGB, CRYS  in the last 72 hours.  Invalid input(s): UACOL, UAPR, USPG, UPH, UTP, UGL, UKET, UBIL, UNIT, UROB, ULEU, UEPI, UWBC, URBC, UBAC, CAST, UCOM, BILUA  Basic Metabolic Panel:  Recent Labs Lab 02/20/17 1230  NA 135  K 3.9  CL 103  CO2 25  GLUCOSE 111*  BUN 14  CREATININE 0.65  CALCIUM 8.3*   GFR Estimated Creatinine Clearance: 115.4 mL/min (by C-G formula based on SCr of 0.65 mg/dL). Liver Function Tests:  Recent Labs Lab 02/20/17 1230  AST 43*  ALT 29  ALKPHOS 146*  BILITOT 0.4  PROT 6.3*  ALBUMIN 3.1*   No results for input(s): LIPASE, AMYLASE in the last 168 hours. No results for input(s): AMMONIA in the last 168 hours. Coagulation profile  Recent Labs Lab 02/20/17 0716 02/20/17 0839  INR 1.17 1.16    CBC:  Recent Labs Lab 02/20/17 0716 02/20/17 0839 02/20/17 1918  WBC 2.0* 1.8* 2.4*  NEUTROABS  --  0.5*  --  HGB 10.2* 9.7* 9.9*  HCT 28.5* 27.5* 27.6*  MCV 83.3 82.8 83.6  PLT <5* <5*  <5* 11*   Cardiac Enzymes: No results for input(s): CKTOTAL, CKMB, CKMBINDEX, TROPONINI in the last 168 hours. BNP: Invalid input(s): POCBNP CBG: No results for input(s): GLUCAP in the last 168 hours. D-Dimer  Recent Labs  02/20/17 0839  DDIMER >20.00*   Hgb A1c No results for input(s): HGBA1C in the last 72 hours. Lipid Profile No results for input(s): CHOL, HDL, LDLCALC, TRIG, CHOLHDL, LDLDIRECT in the last 72 hours. Thyroid function studies No results for input(s): TSH, T4TOTAL, T3FREE, THYROIDAB in the last 72 hours.  Invalid input(s): FREET3 Anemia work up  [employer]  02/20/17 0839  RETICCTPCT 0.6   Microbiology No results found for this or any previous visit (from the past 240 hour(s)).    Studies:  Ct Biopsy  Result Date: 02/20/2017 INDICATION: Concern for multiple myeloma. Please perform CT-guided bone marrow biopsy for tissue diagnostic purposes. EXAM: CT-GUIDED BONE MARROW BIOPSY AND ASPIRATION MEDICATIONS: None ANESTHESIA/SEDATION:  Fentanyl 100 mcg IV; Versed 4 mg IV Sedation Time: 14 minutes; The patient was continuously monitored during the procedure by the interventional radiology nurse under my direct supervision. COMPLICATIONS: None immediate. PROCEDURE: Informed consent was obtained from the patient following an explanation of the procedure, risks, benefits and alternatives. The patient understands, agrees and consents for the procedure. All questions were addressed. A time out was performed prior to the initiation of the procedure. The patient was positioned prone and non-contrast localization CT was performed of the pelvis to demonstrate the iliac marrow spaces. The operative site was prepped and draped in the usual sterile fashion. Under sterile conditions and local anesthesia, a 22 gauge spinal needle was utilized for procedural planning. Next, an 11 gauge coaxial bone biopsy needle was advanced into the left iliac marrow space. Needle position was confirmed with CT imaging. Initially, bone marrow aspiration was performed. Next, a bone marrow biopsy was obtained with the 11 gauge outer bone marrow device. The 11 gauge coaxial bone biopsy needle was re-advanced into a slightly different location within the left iliac marrow space, positioning was confirmed and an additional bone marrow biopsy was obtained. Samples were prepared with the cytotechnologist and deemed adequate. The needle was removed intact. Hemostasis was obtained with compression and a dressing was placed. The patient tolerated the procedure well without immediate post procedural complication. IMPRESSION: Successful CT guided left iliac bone marrow aspiration and core biopsy. Electronically Signed   By: Sandi Mariscal M.D.   On: 02/20/2017 11:19   Ct Bone Marrow Biopsy  Result Date: 02/20/2017 INDICATION: Concern for multiple myeloma. Please perform CT-guided bone marrow biopsy for tissue diagnostic purposes. EXAM: CT-GUIDED BONE MARROW BIOPSY AND ASPIRATION  MEDICATIONS: None ANESTHESIA/SEDATION: Fentanyl 100 mcg IV; Versed 4 mg IV Sedation Time: 14 minutes; The patient was continuously monitored during the procedure by the interventional radiology nurse under my direct supervision. COMPLICATIONS: None immediate. PROCEDURE: Informed consent was obtained from the patient following an explanation of the procedure, risks, benefits and alternatives. The patient understands, agrees and consents for the procedure. All questions were addressed. A time out was performed prior to the initiation of the procedure. The patient was positioned prone and non-contrast localization CT was performed of the pelvis to demonstrate the iliac marrow spaces. The operative site was prepped and draped in the usual sterile fashion. Under sterile conditions and local anesthesia, a 22 gauge spinal needle was utilized for procedural planning. Next, an  11 gauge coaxial bone biopsy needle was advanced into the left iliac marrow space. Needle position was confirmed with CT imaging. Initially, bone marrow aspiration was performed. Next, a bone marrow biopsy was obtained with the 11 gauge outer bone marrow device. The 11 gauge coaxial bone biopsy needle was re-advanced into a slightly different location within the left iliac marrow space, positioning was confirmed and an additional bone marrow biopsy was obtained. Samples were prepared with the cytotechnologist and deemed adequate. The needle was removed intact. Hemostasis was obtained with compression and a dressing was placed. The patient tolerated the procedure well without immediate post procedural complication. IMPRESSION: Successful CT guided left iliac bone marrow aspiration and core biopsy. Electronically Signed   By: Sandi Mariscal M.D.   On: 02/20/2017 11:19    Assessment: 56 y.o.   1. 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 Recent PE, on Xarelto  3. Body pain, secondary to #1  Plan:   -lab showed no evidence of hemolysis, I spoke with Dr. 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 I spoke with pathologist Dr. Gari Crown in the late afternoon. This happened after I saw pt and his family. Plan to discuss with them tomorrow morning -Likely will transfer him to [facility] tomorrow  -will check uric acid, repeat DIC panel in the morning -I will see pt tomorrow as soon as I have the final bone marrow biopsy result, and discuss with his family    Truitt Merle, MD 02/20/2017  7:56 PM

## 2017-02-20 NOTE — Progress Notes (Signed)
CRITICAL VALUE ALERT  Critical Value:  PLT <5  Date & Time Notied:  02/20/17 2018  Provider Notified: Rowe Robert, PA  Orders Received/Actions taken: No further orders given.  Lennette Bihari will notify Dr. Pascal Lux for further orders.

## 2017-02-20 NOTE — Discharge Instructions (Signed)
Aspiracin y biopsia de mdula sea en los adultos, cuidados posteriores (Bone Marrow Aspiration and Bone Marrow Biopsy, Adult, Care After) Lea esta informacin sobre cmo cuidarse despus del procedimiento. El mdico tambin podr darle instrucciones ms especficas. Comunquese con el mdico si tiene problemas o preguntas. QU ESPERAR DESPUS DEL PROCEDIMIENTO Despus del procedimiento, es comn [employer] siguientes sntomas:  Dolor leve y sensibilidad.  Hinchazn.  [place]. [place] los medicamentos recetados o de venta libre como se lo haya indicado el mdico.  No tome baos de inmersin, no nade ni use el jacuzzi hasta que el mdico lo autorice. Pregunte si puede tomar una ducha o un bao de esponja.  Siga las indicaciones del mdico acerca del cuidado del sitio de la puncin. Haga lo siguiente: ? Lvese las manos con agua y jabn antes de Quar[employer] manager las vendas (vendaje). Use desinfectante para manos si no dispone de [place]. ? Cambie el vendaje como se lo haya indicado el mdico.  Controle el sitio de la puncin todos los das para descartar signos de infeccin. Est atento a los siguientes signos: ? Aumento del enrojecimiento, de la hinchazn o del dolor. ? Ms lquido Delorise Shiner. ? Calor. ? Pus o mal olor.  Retome sus actividades normales como se lo haya indicado el mdico. Pregntele al mdico qu actividades son seguras para usted.  No conduzca durante 24horas si le dieron un medicamento para ayudarlo a que se relaje (sedante).  Concurra a todas las visitas de control como se lo haya indicado el mdico. Esto es importante. SOLICITE ATENCIN MDICA SI:  Aumentan el enrojecimiento, la hinchazn o el dolor alrededor de la zona de la puncin.  Observa ms lquido o sangre que proviene del sitio de la puncin.  La zona de la puncin est caliente al tacto.  Tiene pus o percibe que sale mal olor del sitio de la puncin.  Tiene  fiebre.  El dolor no se alivia con los [employer]. Esta informacin no tiene Marine scientist el consejo del mdico. Asegrese de hacerle al mdico cualquier pregunta que tenga. Document Released: 12/30/2012 Document Revised: 01/19/2015 Document Reviewed: 02/16/2016 Elsevier Interactive Patient Education  2018 [place] consciente moderada en los adultos, cuidados posteriores (Moderate Conscious Sedation, Adult, Care After) Estas indicaciones le proporcionan informacin acerca de cmo deber cuidarse despus del procedimiento. El mdico tambin podr darle instrucciones ms especficas. El tratamiento ha sido planificado segn las prcticas mdicas actuales, pero en algunos casos pueden ocurrir problemas. Comunquese con el mdico si tiene algn problema o dudas despus del procedimiento. QU ESPERAR DESPUS DEL PROCEDIMIENTO Despus del procedimiento, es comn:  Sentirse somnoliento durante varias horas.  Sentirse torpe y [employer] de equilibrio durante varias horas.  Perder el sentido de la realidad durante varias horas.  Vomitar si come [employer]. INSTRUCCIONES PARA EL CUIDADO EN EL HOGAR Durante al menos 24horas despus del procedimiento:  No haga lo siguiente: ? Participar en actividades que impliquen posibles cadas o lesiones. ? Conducir vehculos. ? Operar maquinarias pesadas. ? Beber alcohol. ? Tomar somnferos o medicamentos que causen somnolencia. ? Firmar documentos legales ni tomar [employer]. ? Cuidar a nios por su cuenta.  Hacer reposo. Comida y bebida  Siga la dieta recomendada por el mdico.  Si vomita: ? Pruebe agua, jugo o sopa cuando usted pueda beber sin vomitar. ? Asegrese de no tener nuseas antes de ingerir alimentos slidos. Instrucciones generales  Permanezca con un adulto responsable hasta  que est completamente despierto y consciente.  Tome los medicamentos de venta libre y los recetados solamente como se  lo haya indicado el mdico.  Si fuma, no lo haga sin supervisin.  Concurra a todas las visitas de control como se lo haya indicado el mdico. Esto es importante. SOLICITE ATENCIN MDICA SI:  Sigue teniendo nuseas o vomitando.  Tiene sensacin de desvanecimiento.  Le aparece una erupcin cutnea.  Tiene fiebre. SOLICITE ATENCIN MDICA DE INMEDIATO SI:  Tiene dificultad para respirar. Esta informacin no tiene Marine scientist el consejo del mdico. Asegrese de hacerle al mdico cualquier pregunta que tenga. Document Released: 09/09/2013 Document Revised: 09/25/2014 Document Reviewed: 12/25/2015 Elsevier Interactive Patient Education  [employer].

## 2017-02-20 NOTE — Consult Note (Signed)
Chief Complaint: Patient was seen in consultation today for CT-guided bone marrow biopsy  Referring Physician(s): Feng,Yan  Supervising Physician: Sandi Mariscal  Patient Status: [place]  History of Present Illness: Guy Walker is a 56 y.o. male recently seen in the  [facility] ED with symptoms of right sided chest pain,shoulder pain, and hemoptysis.Subsequent imaging revealed right lower lobe PE as well as multiple lytic bony lesions throughout the lumbar and thoracic spine concerning for lytic metastatic disease or multiple myeloma. He presents today for CT guided bone marrow biopsy for further evaluation. Pt also noted to be pancytopenic today.  Past Medical History:  Diagnosis Date  . Back pain   . Cellulitis   . Dyspnea    with increased exertion   . Heart palpitations   . Hypertension   . MRSA (methicillin resistant Staphylococcus aureus)   . Numbness in both legs   . Pleural effusion on right   . Pulmonary embolism ([place])   . Sepsis ([place])   . Tobacco use     Past Surgical History:  Procedure Laterality Date  . INCISION AND DRAINAGE ABSCESS N/A 10/30/2013   Procedure: INCISION AND DRAINAGE ABSCESS LEFT INNER GROIN;  Surgeon: Pedro Earls, MD;  Location: [facility];  Service: General;  Laterality: N/A;  . Nodules removed from throat  1970's    Allergies: Patient has no known allergies.  Medications: Prior to Admission medications   Medication Sig Start Date End Date Taking? Authorizing Provider  amLODipine (NORVASC) 5 MG tablet Take 1 tablet (5 mg total) by mouth daily. 02/09/17  Yes Scot Jun, FNP  docusate sodium (COLACE) 100 MG capsule Take 1 capsule (100 mg total) by mouth every 12 (twelve) hours. 02/08/17  Yes Charlesetta Shanks, MD  ibuprofen (ADVIL,MOTRIN) 800 MG tablet Take 1 tablet (800 mg total) by mouth every 8 (eight) hours as needed. Patient taking differently: Take 400-800 mg by mouth every 8 (eight) hours as needed.  02/05/17  Yes Florencia Reasons, MD  oxyCODONE-acetaminophen (PERCOCET) 5-325 MG tablet Take 1-2 tablets by mouth every 6 (six) hours as needed for moderate pain or severe pain. Do not refill after 03/03/2017 02/16/17 03/03/17 Yes Scot Jun, FNP  polyethylene glycol [facility] / GLYCOLAX) packet Take 17 g by mouth daily. 02/08/17  Yes Charlesetta Shanks, MD  Rivaroxaban 15 & 20 MG TBPK Take as directed on package: Start with one 55m tablet by mouth twice a day with food. On Day 22, switch to one 234mtablet once a day with food. 02/05/17  Yes XuFlorencia ReasonsMD  HYDROcodone-acetaminophen (NORCO) 5-325 MG tablet Take 1 tablet by mouth every 6 (six) hours as needed for moderate pain. 02/05/17   XuFlorencia ReasonsMD  rivaroxaban (XARELTO) 20 MG TABS tablet Take 1 tablet (20 mg total) by mouth daily with supper. 02/09/17   HaScot JunFNP     Family History  Problem Relation Age of Onset  . Hypertension Mother   . Heart failure Father     Social History   Social History  . Marital status: Married    Spouse name: N/A  . Number of children: N/A  . Years of education: N/A   Social History Main Topics  . Smoking status: Former Smoker    Packs/day: 0.25    Types: Cigarettes  . Smokeless tobacco: Never Used  . Alcohol use No  . Drug use: No  . Sexual activity: Not Currently   Other Topics Concern  . None  Social History Narrative  . None      Review of Systems positives include weight loss, recent fever, night sweats, headache, chest/shoulder, hip and back pain, dyspnea, fatigue; denies nausea, vomiting or abnormal bleeding.  Vital Signs: BP 137/87 (BP Location: Right Arm)   Pulse 95   Temp 98.4 F (36.9 C) (Oral)   Resp 18   Ht '5\' 9"'$  (1.753 m)   Wt 196 lb (88.9 kg)   SpO2 96%   BMI 28.94 kg/m   Physical Exam awake, alert. Chest with slightly diminished breath sounds right base, left clear. Heart with regular rate and rhythm. Abdomen soft, positive bowel sounds, nontender. No lower extremity  edema.  Mallampati Score:     Imaging: Dg Chest 2 View  Result Date: 02/02/2017 CLINICAL DATA:  56 year old male with chest pain and cough for 2 days. EXAM: CHEST  2 VIEW COMPARISON:  04/25/2009 chest CT FINDINGS: This is a low volume film. Streaky and ill-defined opacity within the left lower lobe may represent pneumonia versus atelectasis. A small right pleural effusion is noted. Cardiomediastinal silhouette is unchanged. Minimal right basilar atelectasis is noted. There is no evidence of pneumothorax or acute bony abnormality. IMPRESSION: Streaky and ill-defined left lower lobe opacity which may represent pneumonia versus atelectasis. Small right pleural effusion. Electronically Signed   By: Margarette Canada M.D.   On: 02/02/2017 13:23   Ct Angio Chest Pe W/cm &/or Wo Cm  Result Date: 02/03/2017 CLINICAL DATA:  Followup CTA in 24 hours may be helpful to confirm the right lower lobe emboli.Chest pain and SOB. EXAM: CT ANGIOGRAPHY CHEST WITH CONTRAST TECHNIQUE: Multidetector CT imaging of the chest was performed using the standard protocol during bolus administration of intravenous contrast. Multiplanar CT image reconstructions and MIPs were obtained to evaluate the vascular anatomy. CONTRAST:  100 mL of Isovue 370 intravenous contrast COMPARISON:  CTA, 02/02/2017 FINDINGS: Cardiovascular: Pulmonary emboli to the right lower lobe posterior basilar segment and lateral segment is unchanged from the prior CT. There is no other convincing evidence of pulmonary emboli. The pulmonary arteries were satisfactory opacified. Study was somewhat degraded by motion. Heart is normal in size. No coronary artery calcifications. Thoracic aorta is normal caliber with no dissection. Mediastinum/Nodes: There are prominent shotty mediastinal lymph nodes. Largest measures 14 mm in short axis, precarinal. These are stable from the prior CT. No mediastinal or hilar masses. Lungs/Pleura: There is patchy opacity in the right lower  lobe at the lung base. This may all be atelectasis. Pneumonia should be considered if there are consistent clinical symptoms. There is a small associated pleural effusion. The appearance is stable from the prior study. Mild subsegmental atelectasis is noted at the lateral left lung base and in the anterior lung bases. Remainder of the lungs is clear. No pneumothorax. No left pleural effusion. Upper Abdomen: Small gallstones.  No acute findings. Musculoskeletal: There are small areas of lucency within multiple vertebra, the largest in the right posterior aspect of T5. These are nonspecific. These are new when compared to a CT of the chest dated 04/25/2009. Consider lytic metastatic disease or multiple myeloma in the differential. Review of the MIP images confirms the above findings. IMPRESSION: 1. Current exam confirms segmental pulmonary emboli to the right lower lobe associated with right lower lobe consolidation and a small effusion. Right lower lobe opacity may reflect combination of atelectasis and infarction. Pneumonia should be considered if there are consistent clinical symptoms. 2. Mild shotty mediastinal adenopathy is stable from the prior exam.  3. Multiple small lucent vertebral bone lesions. These are concerning for lytic metastatic disease or multiple myeloma. Electronically Signed   By: Lajean Manes M.D.   On: 02/03/2017 15:25   Ct Angio Chest Pe W/cm &/or Wo Cm  Result Date: 02/02/2017 CLINICAL DATA:  Persistent right rib and shoulder pain beginning 3 days ago after episode of choking while eating. Pain worse with deep inspiration, coughing and sneezing. Mild hemoptysis. EXAM: CT ANGIOGRAPHY CHEST WITH CONTRAST TECHNIQUE: Multidetector CT imaging of the chest was performed using the standard protocol during bolus administration of intravenous contrast. Multiplanar CT image reconstructions and MIPs were obtained to evaluate the vascular anatomy. CONTRAST:  100 mL Isovue 370 IV. COMPARISON:  CT  04/25/2009 FINDINGS: Cardiovascular: Heart is normal in size. Thoracic aorta is within normal. Pulmonary arterial system is well opacified no evidence of left-sided pulmonary emboli. Findings suggest small emboli over the right lower lobe pulmonary artery lateral basilar segment. RV/LV ratio is 43.2/ 50.8 = 0.83. Mediastinum/Nodes: 1 cm right peritracheal lymph node likely reactive. Possible 1.1 cm precarinal and subcarinal lymph nodes likely reactive. Adenopathy. Remaining mediastinal structures are unremarkable. Lungs/Pleura: Lungs are adequately inflated with heterogeneous airspace opacification over the right lower lobe with small amount right pleural fluid. Minimal linear atelectasis over the right middle lobe and lingula. Airways are within normal. Upper Abdomen: Minimal cholelithiasis. Musculoskeletal: Unremarkable. Review of the MIP images confirms the above findings. IMPRESSION: Findings suggest small emboli over the right lower lobe pulmonary artery. Heterogeneous airspace opacification over the right lower lobe with small right pleural effusion. Findings may be due to atelectasis and infarction, although cannot exclude pneumonia. Mild mediastinal adenopathy likely reactive. Followup CTA in 24 hours may be helpful to confirm the right lower lobe emboli. Minimal cholelithiasis. Critical Value/emergent results were called by telephone at the time of interpretation on 02/02/2017 at 3:57 pm to Dr. Flonnie Overman , who verbally acknowledged these results. Electronically Signed   By: Marin Olp M.D.   On: 02/02/2017 15:47   Ct Abdomen Pelvis W Contrast  Result Date: 02/04/2017 CLINICAL DATA:  Acute onset of right chest and shoulder pain. Recently diagnosed with pulmonary emboli, and question of multiple myeloma. Initial encounter. EXAM: CT ABDOMEN AND PELVIS WITH CONTRAST TECHNIQUE: Multidetector CT imaging of the abdomen and pelvis was performed using the standard protocol following bolus administration  of intravenous contrast. CONTRAST:  162m ISOVUE-300 IOPAMIDOL (ISOVUE-300) INJECTION 61% COMPARISON:  CTA of the chest performed 02/03/2017 FINDINGS: Lower chest: A trace right-sided pleural effusion is noted. Right basilar airspace opacity reflects the patient's previously diagnosed right-sided pulmonary emboli. Hepatobiliary: The liver is unremarkable in appearance. Stones are noted dependently within the gallbladder. The gallbladder is otherwise unremarkable. The common bile duct remains normal in caliber. Pancreas: The pancreas is within normal limits. Spleen: The spleen is unremarkable in appearance. Adrenals/Urinary Tract: The adrenal glands are unremarkable in appearance. A small right renal cyst is noted. There is no evidence of hydronephrosis. No renal or ureteral stones are identified. No perinephric stranding is seen. Stomach/Bowel: The stomach is unremarkable in appearance. The small bowel is within normal limits. The appendix is normal in caliber, without evidence of appendicitis. The colon is unremarkable in appearance. Vascular/Lymphatic: The abdominal aorta is unremarkable in appearance. The inferior vena cava is grossly unremarkable. No retroperitoneal lymphadenopathy is seen. No pelvic sidewall lymphadenopathy is identified. Reproductive: The bladder is mildly distended and grossly unremarkable. The prostate remains normal in size. Other: No additional soft tissue abnormalities are seen. Musculoskeletal: Scattered  lucencies along the lower thoracic and lumbar spine may reflect multiple myeloma or possibly metastatic disease. There is chronic mild loss of height involving the superior endplate of M25. Vacuum phenomenon is noted at L4-L5, with associated sclerosis. The visualized musculature is unremarkable in appearance. IMPRESSION: 1. Scattered lucencies along the lower thoracic and lumbar spine may reflect multiple myeloma or possibly metastatic disease. Chronic mild loss of height involving the  superior endplate of O03. 2. Small right renal cyst. 3. Cholelithiasis.  Gallbladder otherwise unremarkable. 4. Trace right-sided pleural effusion. Right basilar airspace opacity reflects the previously diagnosed right-sided pulmonary emboli. Electronically Signed   By: Garald Balding M.D.   On: 02/04/2017 18:38   Dg Bone Survey Met  Result Date: 02/04/2017 CLINICAL DATA:  Known lucencies of the thoracic spine EXAM: METASTATIC BONE SURVEY COMPARISON:  02/03/2017 FINDINGS: Metastatic bone survey was performed. Skull: Small lucency is noted in the parietal bone on the lateral film of the skull. Cervical spine: Degenerative changes without acute abnormality. Bilateral shoulders: Degenerative changes of the right acromioclavicular joint is noted. No definite lucencies are seen. Bilateral humeri and forearms:  No active lesions are noted. Chest: The lesions are seen. Bibasilar changes are noted right greater than left similar to that seen recent CT examination. Thoracic spine: Pedicles are within normal limits. Vertebral body height is well maintained. The lucency seen on recent CT are not well appreciated on this exam. Lumbar spine: Degenerative changes are noted phase vertebral body height is well maintained. No lucencies are identified. Bilateral femurs and tib-fib:  No definitive lucencies are noted. Pelvis:  No definitive lytic lesions noted. IMPRESSION: Single lucency within the parietal bone on the lateral film of the skull. No other lucencies are identified. Electronically Signed   By: Inez Catalina M.D.   On: 02/04/2017 18:24    Labs:  CBC:  Recent Labs  02/05/17 0508 02/08/17 0828 02/09/17 1222 02/20/17 0716  WBC 7.9 6.4 6.3 2.0*  HGB 13.3 14.9 14.4 10.2*  HCT 38.1* 42.0 42.5 28.5*  PLT 240 168 141 <5*    COAGS:  Recent Labs  02/02/17 1730 02/08/17 0828 02/20/17 0716  INR 1.00 1.32 1.17  APTT 33  --  47*    BMP:  Recent Labs  02/04/17 0546 02/05/17 0508 02/08/17 0828  02/09/17 1222  NA 133* 136 136 133*  K 4.3 4.3 4.4 4.5  CL 100* 104 104 99  CO2 24 23 23 22   GLUCOSE 94 116* 131* 125*  BUN 15 14 20 16   CALCIUM 8.7* 8.9 9.3 9.2  CREATININE 0.79 0.83 1.01 0.74  GFRNONAA >60 >60 >60 >89  GFRAA >60 >60 >60 >89    LIVER FUNCTION TESTS:  Recent Labs  02/03/17 0133 02/04/17 0546 02/08/17 0828 02/09/17 1222  BILITOT 0.2* 0.5 0.5 0.5  AST 24 26 191* 35  ALT 28 33 72* 46  ALKPHOS 126 127* 215* 194*  PROT 6.6 6.7 7.4 6.7  ALBUMIN 3.4* 3.3* 3.8 3.8    TUMOR MARKERS:  Recent Labs  02/04/17 0546  CEA 0.8    Assessment and Plan: 56 y.o. male recently seen in the  [facility] ED with symptoms of right sided chest pain,shoulder pain, and hemoptysis.Subsequent imaging revealed right lower lobe PE as well as multiple lytic bony lesions throughout the lumbar and thoracic spine concerning for lytic metastatic disease or multiple myeloma. He presents today for CT guided bone marrow biopsy for further evaluation. Pt also noted to be pancytopenic today.Risks and benefits discussed with  the patient/family via interpreter including, but not limited to bleeding, infection, damage to adjacent structures or low yield requiring additional tests.All of the patient's questions were answered, patient is agreeable to proceed.Consent signed and in chart. Following  bone marrow biopsy patient will return to [facility] for appt with Dr. Burr Medico and possible platelet transfusion. Dr. Burr Medico has been updated on patient's latest lab studies.     Thank you for this interesting consult.  I greatly enjoyed meeting Guy Walker and look forward to participating in their care.  A copy of this report was sent to the requesting provider on this date.  Electronically Signed: D. Rowe Robert, PA-C 02/20/2017, 8:24 AM   I spent a total of 25 minutes in face to face in clinical consultation, greater than 50% of which was counseling/coordinating care for CT-guided bone marrow  biopsy

## 2017-02-20 NOTE — Progress Notes (Signed)
Report called to [place], [place]

## 2017-02-21 DIAGNOSIS — D61818 Other pancytopenia: Secondary | ICD-10-CM

## 2017-02-21 LAB — PREPARE PLATELET PHERESIS
UNIT DIVISION: 0
Unit division: 0

## 2017-02-21 LAB — BASIC METABOLIC PANEL
Anion gap: 7 (ref 5–15)
BUN: 16 mg/dL (ref 6–20)
CHLORIDE: 103 mmol/L (ref 101–111)
CO2: 27 mmol/L (ref 22–32)
Calcium: 8.4 mg/dL — ABNORMAL LOW (ref 8.9–10.3)
Creatinine, Ser: 0.61 mg/dL (ref 0.61–1.24)
GFR calc Af Amer: >60 mL/min (ref >60)
GFR calc non Af Amer: >60 mL/min (ref >60)
GLUCOSE: 113 mg/dL — AB (ref 65–99)
POTASSIUM: 4.3 mmol/L (ref 3.5–5.1)
Sodium: 137 mmol/L (ref 135–145)

## 2017-02-21 LAB — CBC
HCT: 24.5 % — ABNORMAL LOW (ref 39.0–52.0)
HEMOGLOBIN: 8.8 g/dL — AB (ref 13.0–17.0)
MCH: 30.1 pg (ref 26.0–34.0)
MCHC: 35.9 g/dL (ref 30.0–36.0)
MCV: 83.9 fL (ref 78.0–100.0)
PLATELETS: 19 10*3/uL — AB (ref 150–400)
RBC: 2.92 MIL/uL — AB (ref 4.22–5.81)
RDW: 13.1 % (ref 11.5–15.5)
WBC: 2.5 10*3/uL — AB (ref 4.0–10.5)

## 2017-02-21 LAB — BPAM PLATELET PHERESIS
BLOOD PRODUCT EXPIRATION DATE: 201806061331
Blood Product Expiration Date: 201806052359
ISSUE DATE / TIME: 201806051516
ISSUE DATE / TIME: 201806052252
Unit Type and Rh: 6200
Unit Type and Rh: 6200

## 2017-02-21 LAB — FERRITIN: Ferritin: 1566 ng/mL — ABNORMAL HIGH (ref 24–336)

## 2017-02-21 LAB — URIC ACID: Uric Acid, Serum: 4.5 mg/dL (ref 4.4–7.6)

## 2017-02-21 LAB — DIC (DISSEMINATED INTRAVASCULAR COAGULATION)PANEL
D-Dimer, Quant: >20 ug/mL-FEU — ABNORMAL HIGH (ref 0.00–0.50)
Fibrinogen: 443 mg/dL (ref 210–475)
INR: 1.11
Prothrombin Time: 14.4 seconds (ref 11.4–15.2)

## 2017-02-21 LAB — DIC (DISSEMINATED INTRAVASCULAR COAGULATION) PANEL
APTT: 42 s — AB (ref 24–36)
PLATELETS: 16 10*3/uL — AB (ref 150–400)

## 2017-02-21 LAB — HEPARIN INDUCED PLATELET AB (HIT ANTIBODY): Heparin Induced Plt Ab: 0.488 OD — ABNORMAL HIGH (ref 0.000–0.400)

## 2017-02-21 LAB — TSH: TSH: 1.71 u[IU]/mL (ref 0.350–4.500)

## 2017-02-21 LAB — FOLATE: FOLATE: 7.1 ng/mL (ref >5.9)

## 2017-02-21 LAB — HAPTOGLOBIN: Haptoglobin: 270 mg/dL — ABNORMAL HIGH (ref 34–200)

## 2017-02-21 LAB — IRON AND TIBC
IRON: 157 ug/dL (ref 45–182)
Saturation Ratios: 91 % — ABNORMAL HIGH (ref 17.9–39.5)
TIBC: 172 ug/dL — ABNORMAL LOW (ref 250–450)
UIBC: 15 ug/dL

## 2017-02-21 LAB — VITAMIN B12: Vitamin B-12: 258 pg/mL (ref 180–914)

## 2017-02-21 MED ORDER — SENNA 8.6 MG PO TABS
2.0000 | ORAL_TABLET | Freq: Every day | ORAL | Status: DC
  Administered 2017-02-21: 17.2 mg via ORAL
  Filled 2017-02-21 (×2): qty 2

## 2017-02-21 MED ORDER — MORPHINE SULFATE (PF) 2 MG/ML IV SOLN
4.0000 mg | INTRAVENOUS | Status: AC | PRN
  Administered 2017-02-21 (×2): 4 mg via INTRAVENOUS
  Filled 2017-02-21 (×2): qty 2

## 2017-02-21 MED ORDER — LIDOCAINE 5 % EX PTCH
1.0000 | MEDICATED_PATCH | CUTANEOUS | Status: DC
  Administered 2017-02-21 (×2): 1 via TRANSDERMAL
  Filled 2017-02-21 (×2): qty 1

## 2017-02-21 NOTE — Progress Notes (Signed)
Patient ID: Guy Walker, male   DOB: Feb 26, 1961, 56 y.o.   MRN: [id number]    PROGRESS NOTE    Melton Walls  JQG:[id number] DOB: 1960-10-01 DOA: 02/20/2017  PCP: Scot Jun, FNP   Brief Narrative:  Pt is 55 yo male who was recently diagnosed with pulmonary embolism in May 2018 and was started on Xarelto. Also at that time, imaging studies done, raised suspicion for vertebral lesion though to be due to multiple myeloma or other malignancy. He was scheduled for bone marrow biopsy in an outpatient setting. This was done 6/5 and he was found to be pancytopenia and therefore admitted for further evaluation.   Assessment & Plan:   Principal Problem:   Pancytopenia ([place]) - Preliminary bone marrow aspirate dry, showed B-cell clonal blasts, ? 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 - has been transfused 1 U Plt and Plt count is trending up from <5 --> 19K - WBC remains ~ 2.5 - Hg trending down from 9.9 --> 8.8 - appreciate oncologist assistance, continue to monitor blood counts - AC on hold until blood counts stabilize  - check CBC in AM - bone marrow biopsy still pending  - If patient develops fever, will need antibiotics  Active Problems:   Pulmonary emboli ([facility]) - AC has been on hold due to high risk of bleeding from severe pancytopenia  Severe bone pain, narcotic induced constipation - Continue bowel regimen - Allow analgesia as needed    Essential hypertension - Reasonable inpatient control so far - Continue Norvasc   DVT prophylaxis:  SCDs  Code Status:  full  Family Communication: Patient at bedside,  discussed current available biopsy report as well as blood work with daughter over the phone. I have also met with daughter in person later in the morning.  Disposition Plan: Home once determined medically stable   Consultants:   Oncology   Procedures:   Bone marrow biopsy 02/20/2017   Antimicrobials:   None  Subjective: Low back pain at the biopsy  site.  Objective: Vitals:   02/20/17 2300 02/20/17 2315 02/21/17 0015 02/21/17 0511  BP: (!) 154/84 (!) 147/78 (!) 138/92 123/73  Pulse: 79 79 84 66  Resp: 18 18 18 17   Temp: 98.3 F (36.8 C) 98 F (36.7 C) 98.6 F (37 C) 98.2 F (36.8 C)  TempSrc: Oral Oral Oral Oral  SpO2: 97% 97%  98%  Weight:      Height:        Intake/Output Summary (Last 24 hours) at 02/21/17 1015 Last data filed at 02/21/17 0500  Gross per 24 hour  Intake             1973 ml  Output                0 ml  Net             1973 ml   Filed Weights   02/20/17 1143  Weight: 88.5 kg (195 lb)    Examination:  General exam: Appears calm and comfortable  Respiratory system: Clear to auscultation. Respiratory effort normal. Cardiovascular system: S1 & S2 heard, RRR. No JVD, murmurs, rubs, gallops or clicks. No pedal edema. Gastrointestinal system: Abdomen is nondistended, soft and nontender. No organomegaly or masses felt.  Central nervous system: Alert and oriented. No focal neurological deficits. Extremities: Symmetric 5 x 5 power. Skin: No rashes, lesions or ulcers Psychiatry: Judgement and insight appear normal. Mood & affect appropriate.   Data Reviewed: I have personally reviewed following labs  and imaging studies  CBC:  Recent Labs Lab 03-09-2017 0716 2017/03/09 0839 2017-03-09 1918 02/21/17 0422  WBC 2.0* 1.8* 2.4* 2.5*  NEUTROABS  --  0.5*  --   --   HGB 10.2* 9.7* 9.9* 8.8*  HCT 28.5* 27.5* 27.6* 24.5*  MCV 83.3 82.8 83.6 83.9  PLT <5* <5*  <5* 11* 16*  19*   Basic Metabolic Panel:  Recent Labs Lab March 09, 2017 1230 02/21/17 0422  NA 135 137  K 3.9 4.3  CL 103 103  CO2 25 27  GLUCOSE 111* 113*  BUN 14 16  CREATININE 0.65 0.61  CALCIUM 8.3* 8.4*   Liver Function Tests:  Recent Labs Lab 03-09-17 1230  AST 43*  ALT 29  ALKPHOS 146*  BILITOT 0.4  PROT 6.3*  ALBUMIN 3.1*   Coagulation Profile:  Recent Labs Lab 03-09-2017 0716 2017/03/09 0839 02/21/17 0422  INR 1.17 1.16  1.11   Thyroid Function Tests:  Recent Labs  02/21/17 0422  TSH 1.710   Anemia Panel:  Recent Labs  03/09/2017 0839 02/21/17 0422  VITAMINB12  --  258  FOLATE  --  7.1  FERRITIN  --  1,566*  TIBC  --  172*  IRON  --  157  RETICCTPCT 0.6  --    Urine analysis:    Component Value Date/Time   COLORURINE YELLOW 02/08/2017 0828   APPEARANCEUR CLEAR 02/08/2017 0828   LABSPEC 1.025 02/09/2017 1221   PHURINE 6.0 02/09/2017 1221   GLUCOSEU NEGATIVE 02/09/2017 1221   HGBUR SMALL (A) 02/09/2017 1221   BILIRUBINUR NEGATIVE 02/09/2017 1221   KETONESUR NEGATIVE 02/09/2017 1221   PROTEINUR 30 (A) 02/09/2017 1221   UROBILINOGEN 2.0 (H) 02/09/2017 1221   NITRITE NEGATIVE 02/09/2017 [place] 02/09/2017 1221   Radiology Studies: Ct Biopsy  Result Date: Mar 09, 2017 INDICATION: Concern for multiple myeloma. Please perform CT-guided bone marrow biopsy for tissue diagnostic purposes. EXAM: CT-GUIDED BONE MARROW BIOPSY AND ASPIRATION MEDICATIONS: None ANESTHESIA/SEDATION: Fentanyl 100 mcg IV; Versed 4 mg IV Sedation Time: 14 minutes; The patient was continuously monitored during the procedure by the interventional radiology nurse under my direct supervision. COMPLICATIONS: None immediate. PROCEDURE: Informed consent was obtained from the patient following an explanation of the procedure, risks, benefits and alternatives. The patient understands, agrees and consents for the procedure. All questions were addressed. A time out was performed prior to the initiation of the procedure. The patient was positioned prone and non-contrast localization CT was performed of the pelvis to demonstrate the iliac marrow spaces. The operative site was prepped and draped in the usual sterile fashion. Under sterile conditions and local anesthesia, a 22 gauge spinal needle was utilized for procedural planning. Next, an 11 gauge coaxial bone biopsy needle was advanced into the left iliac marrow space.  Needle position was confirmed with CT imaging. Initially, bone marrow aspiration was performed. Next, a bone marrow biopsy was obtained with the 11 gauge outer bone marrow device. The 11 gauge coaxial bone biopsy needle was re-advanced into a slightly different location within the left iliac marrow space, positioning was confirmed and an additional bone marrow biopsy was obtained. Samples were prepared with the cytotechnologist and deemed adequate. The needle was removed intact. Hemostasis was obtained with compression and a dressing was placed. The patient tolerated the procedure well without immediate post procedural complication. IMPRESSION: Successful CT guided left iliac bone marrow aspiration and core biopsy. Electronically Signed   By: Sandi Mariscal M.D.   On: 2017-03-09 11:19  Ct Bone Marrow Biopsy  Result Date: 02/20/2017 INDICATION: Concern for multiple myeloma. Please perform CT-guided bone marrow biopsy for tissue diagnostic purposes. EXAM: CT-GUIDED BONE MARROW BIOPSY AND ASPIRATION MEDICATIONS: None ANESTHESIA/SEDATION: Fentanyl 100 mcg IV; Versed 4 mg IV Sedation Time: 14 minutes; The patient was continuously monitored during the procedure by the interventional radiology nurse under my direct supervision. COMPLICATIONS: None immediate. PROCEDURE: Informed consent was obtained from the patient following an explanation of the procedure, risks, benefits and alternatives. The patient understands, agrees and consents for the procedure. All questions were addressed. A time out was performed prior to the initiation of the procedure. The patient was positioned prone and non-contrast localization CT was performed of the pelvis to demonstrate the iliac marrow spaces. The operative site was prepped and draped in the usual sterile fashion. Under sterile conditions and local anesthesia, a 22 gauge spinal needle was utilized for procedural planning. Next, an 11 gauge coaxial bone biopsy needle was advanced into  the left iliac marrow space. Needle position was confirmed with CT imaging. Initially, bone marrow aspiration was performed. Next, a bone marrow biopsy was obtained with the 11 gauge outer bone marrow device. The 11 gauge coaxial bone biopsy needle was re-advanced into a slightly different location within the left iliac marrow space, positioning was confirmed and an additional bone marrow biopsy was obtained. Samples were prepared with the cytotechnologist and deemed adequate. The needle was removed intact. Hemostasis was obtained with compression and a dressing was placed. The patient tolerated the procedure well without immediate post procedural complication. IMPRESSION: Successful CT guided left iliac bone marrow aspiration and core biopsy. Electronically Signed   By: Sandi Mariscal M.D.   On: 02/20/2017 11:19    Scheduled Meds: . amLODipine  5 mg Oral Daily  . feeding supplement (ENSURE ENLIVE)  237 mL Oral BID BM  . polyethylene glycol  17 g Oral Daily  . senna  2 tablet Oral QHS  . sodium chloride flush  3 mL Intravenous Q12H   Continuous Infusions: . sodium chloride 250 mL (02/20/17 1542)  . sodium chloride    . sodium chloride       LOS: 0 days   Time spent: 35 minutes   Faye Ramsay, MD [facility] Pager [phone]  If 7PM-7AM, please contact night-coverage [email or web address] Password [email or web address] 02/21/2017, 10:15 AM

## 2017-02-21 NOTE — Discharge Instructions (Signed)
Biopsia sea por puncin, cuidados posteriores (Needle Biopsy of the Bone, Care After) Siga estas instrucciones durante las prximas semanas. Estas indicaciones le proporcionan informacin acerca de cmo deber cuidarse despus del procedimiento. El mdico tambin podr darle instrucciones ms especficas. El tratamiento ha sido planificado segn las prcticas mdicas actuales, pero en algunos casos pueden ocurrir problemas. Comunquese con el mdico si tiene algn problema o dudas despus del procedimiento. QU ESPERAR DESPUS DEL PROCEDIMIENTO Despus del procedimiento, es normal sentir dolor o sensibilidad con la palpacin en el lugar de la puncin. [place] los medicamentos de venta libre y los recetados solamente como se lo haya indicado el mdico.  Bese y dchese como se lo haya indicado el mdico.  Siga las indicaciones del mdico acerca de lo siguiente: ? Cmo Government social research officer de la puncin. ? Cmo y cundo cambiar las vendas (vendaje). ? Cundo retirar el vendaje.  Controle el sitio de la puncin todos los das para descartar signos de infeccin. Est atento a lo siguiente: ? Enrojecimiento, hinchazn o dolor ms intenso. ? Lquido, sangre o pus.  Reanude sus actividades normales como se lo haya indicado el mdico.  Concurra a todas las visitas de control como se lo haya indicado el mdico. Esto es importante. SOLICITE ATENCIN MDICA SI:  Tiene enrojecimiento, hinchazn o dolor ms intenso en el lugar de la puncin.  Observa lquido, sangre o pus que salen del lugar de la puncin.  Tiene fiebre.  Tiene nuseas o vmitos persistentes. SOLICITE ATENCIN MDICA DE INMEDIATO SI:  Le aparece una erupcin cutnea.  Tiene dificultad para respirar. Esta informacin no tiene Marine scientist el consejo del mdico. Asegrese de hacerle al mdico cualquier pregunta que tenga. Document Released: 06/25/2013 Document Revised: 05/26/2015  Document Reviewed: 10/12/2014 Elsevier Interactive Patient Education  [employer].

## 2017-02-21 NOTE — Progress Notes (Signed)
Guy Walker   DOB:1961-05-05   AV#:[id number]   PVV#:[id number]  Oncology follow up note  Subjective: Patient received 1 unit of platelets yesterday. VS stable, no fever or bleeding, no other complaints.  Objective:  Vitals:   02/21/17 0015 02/21/17 0511  BP: (!) 138/92 123/73  Pulse: 84 66  Resp: 18 17  Temp: 98.6 F (37 C) 98.2 F (36.8 C)    Body mass index is 29.65 kg/m.  Intake/Output Summary (Last 24 hours) at 02/21/17 1405 Last data filed at 02/21/17 1100  Gross per 24 hour  Intake             1973 ml  Output              200 ml  Net             1773 ml     Sclerae unicteric  Oropharynx clear  No peripheral adenopathy  Lungs clear -- no rales or rhonchi  Heart regular rate and rhythm  Abdomen benign  MSK no focal spinal tenderness, no peripheral edema  Neuro nonfocal  Breast exam: NA  CBG (last 3)  No results for input(s): GLUCAP in the last 72 hours.   Labs:  Lab Results  Component Value Date   WBC 2.5 (L) 02/21/2017   HGB 8.8 (L) 02/21/2017   HCT 24.5 (L) 02/21/2017   MCV 83.9 02/21/2017   PLT 19 (LL) 02/21/2017   PLT 16 (LL) 02/21/2017   NEUTROABS 0.5 (L) 02/20/2017   CMP Latest Ref Rng & Units 02/21/2017 02/20/2017 02/09/2017  Glucose 65 - 99 mg/dL 113(H) 111(H) 125(H)  BUN 6 - 20 mg/dL 16 14 16   Creatinine 0.61 - 1.24 mg/dL 0.61 0.65 0.74  Sodium 135 - 145 mmol/L 137 135 133(L)  Potassium 3.5 - 5.1 mmol/L 4.3 3.9 4.5  Chloride 101 - 111 mmol/L 103 103 99  CO2 22 - 32 mmol/L 27 25 22   Calcium 8.9 - 10.3 mg/dL 8.4(L) 8.3(L) 9.2  Total Protein 6.5 - 8.1 g/dL - 6.3(L) 6.7  Total Bilirubin 0.3 - 1.2 mg/dL - 0.4 0.5  Alkaline Phos 38 - 126 U/L - 146(H) 194(H)  AST 15 - 41 U/L - 43(H) 35  ALT 17 - 63 U/L - 29 46     Urine Studies No results for input(s): UHGB, CRYS in the last 72 hours.  Invalid input(s): UACOL, UAPR, USPG, UPH, UTP, UGL, UKET, UBIL, UNIT, UROB, ULEU, UEPI, UWBC, URBC, UBAC, CAST, UCOM, BILUA  Basic Metabolic  Panel:  Recent Labs Lab 02/20/17 1230 02/21/17 0422  NA 135 137  K 3.9 4.3  CL 103 103  CO2 25 27  GLUCOSE 111* 113*  BUN 14 16  CREATININE 0.65 0.61  CALCIUM 8.3* 8.4*   GFR Estimated Creatinine Clearance: 115.4 mL/min (by C-G formula based on SCr of 0.61 mg/dL). Liver Function Tests:  Recent Labs Lab 02/20/17 1230  AST 43*  ALT 29  ALKPHOS 146*  BILITOT 0.4  PROT 6.3*  ALBUMIN 3.1*   No results for input(s): LIPASE, AMYLASE in the last 168 hours. No results for input(s): AMMONIA in the last 168 hours. Coagulation profile  Recent Labs Lab 02/20/17 0716 02/20/17 0839 02/21/17 0422  INR 1.17 1.16 1.11    CBC:  Recent Labs Lab 02/20/17 0716 02/20/17 0839 02/20/17 1918 02/21/17 0422  WBC 2.0* 1.8* 2.4* 2.5*  NEUTROABS  --  0.5*  --   --   HGB 10.2* 9.7* 9.9* 8.8*  HCT 28.5* 27.5* 27.6*  24.5*  MCV 83.3 82.8 83.6 83.9  PLT <5* <5*  <5* 11* 16*  19*   Cardiac Enzymes: No results for input(s): CKTOTAL, CKMB, CKMBINDEX, TROPONINI in the last 168 hours. BNP: Invalid input(s): POCBNP CBG: No results for input(s): GLUCAP in the last 168 hours. D-Dimer  Recent Labs  02/20/17 0839 02/21/17 0422  DDIMER >20.00* >20.00*   Hgb A1c No results for input(s): HGBA1C in the last 72 hours. Lipid Profile No results for input(s): CHOL, HDL, LDLCALC, TRIG, CHOLHDL, LDLDIRECT in the last 72 hours. Thyroid function studies  Recent Labs  02/21/17 0422  TSH 1.710   Anemia work up  Recent Labs  02/20/17 0839 02/21/17 0422  VITAMINB12  --  258  FOLATE  --  7.1  FERRITIN  --  1,566*  TIBC  --  172*  IRON  --  157  RETICCTPCT 0.6  --    Microbiology No results found for this or any previous visit (from the past 240 hour(s)).    Studies:  Ct Biopsy  Result Date: 02/20/2017 INDICATION: Concern for multiple myeloma. Please perform CT-guided bone marrow biopsy for tissue diagnostic purposes. EXAM: CT-GUIDED BONE MARROW BIOPSY AND ASPIRATION  MEDICATIONS: None ANESTHESIA/SEDATION: Fentanyl 100 mcg IV; Versed 4 mg IV Sedation Time: 14 minutes; The patient was continuously monitored during the procedure by the interventional radiology nurse under my direct supervision. COMPLICATIONS: None immediate. PROCEDURE: Informed consent was obtained from the patient following an explanation of the procedure, risks, benefits and alternatives. The patient understands, agrees and consents for the procedure. All questions were addressed. A time out was performed prior to the initiation of the procedure. The patient was positioned prone and non-contrast localization CT was performed of the pelvis to demonstrate the iliac marrow spaces. The operative site was prepped and draped in the usual sterile fashion. Under sterile conditions and local anesthesia, a 22 gauge spinal needle was utilized for procedural planning. Next, an 11 gauge coaxial bone biopsy needle was advanced into the left iliac marrow space. Needle position was confirmed with CT imaging. Initially, bone marrow aspiration was performed. Next, a bone marrow biopsy was obtained with the 11 gauge outer bone marrow device. The 11 gauge coaxial bone biopsy needle was re-advanced into a slightly different location within the left iliac marrow space, positioning was confirmed and an additional bone marrow biopsy was obtained. Samples were prepared with the cytotechnologist and deemed adequate. The needle was removed intact. Hemostasis was obtained with compression and a dressing was placed. The patient tolerated the procedure well without immediate post procedural complication. IMPRESSION: Successful CT guided left iliac bone marrow aspiration and core biopsy. Electronically Signed   By: Sandi Mariscal M.D.   On: 02/20/2017 11:19   Ct Bone Marrow Biopsy  Result Date: 02/20/2017 INDICATION: Concern for multiple myeloma. Please perform CT-guided bone marrow biopsy for tissue diagnostic purposes. EXAM: CT-GUIDED BONE  MARROW BIOPSY AND ASPIRATION MEDICATIONS: None ANESTHESIA/SEDATION: Fentanyl 100 mcg IV; Versed 4 mg IV Sedation Time: 14 minutes; The patient was continuously monitored during the procedure by the interventional radiology nurse under my direct supervision. COMPLICATIONS: None immediate. PROCEDURE: Informed consent was obtained from the patient following an explanation of the procedure, risks, benefits and alternatives. The patient understands, agrees and consents for the procedure. All questions were addressed. A time out was performed prior to the initiation of the procedure. The patient was positioned prone and non-contrast localization CT was performed of the pelvis to demonstrate the iliac marrow spaces. The operative  site was prepped and draped in the usual sterile fashion. Under sterile conditions and local anesthesia, a 22 gauge spinal needle was utilized for procedural planning. Next, an 11 gauge coaxial bone biopsy needle was advanced into the left iliac marrow space. Needle position was confirmed with CT imaging. Initially, bone marrow aspiration was performed. Next, a bone marrow biopsy was obtained with the 11 gauge outer bone marrow device. The 11 gauge coaxial bone biopsy needle was re-advanced into a slightly different location within the left iliac marrow space, positioning was confirmed and an additional bone marrow biopsy was obtained. Samples were prepared with the cytotechnologist and deemed adequate. The needle was removed intact. Hemostasis was obtained with compression and a dressing was placed. The patient tolerated the procedure well without immediate post procedural complication. IMPRESSION: Successful CT guided left iliac bone marrow aspiration and core biopsy. Electronically Signed   By: Sandi Mariscal M.D.   On: 02/20/2017 11:19    Assessment: 56 y.o. male   1. Severe pancytopenia, secondary to newly diagnosed B-cell acute lymphoblastic leukemia 2. Recent PE, Xarelto held due to   severe thrombus cytopenia 3. 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 I have discussed with the diagnosis, prognosis, treatment with patient and his daughter extensively. We used the medial translating service.  -I recommend him to be transferred to [facility] unit. I spoke with the attending Dr. Lissa Merlin who has kindly accepted the pt. He is likely going to be transferred later today. -Lab reviewed, uric acid normal, platelet count 19K this morning after transfusion. No active bleeding. His vital signs are stable. -I have spoken with pathology lab, and he requests fish t(9,22) from his marrow biopsy. His bone marrow biopsy slides will be sent to [facility] Per Dr. Eustace Moore request.  -lab reviewed, no TLS  -I remain to be available if he needs f/u in [facility]   Pt had many questions about the diagnosis, overall prognosis, and treatment. I answered all to the best of my knowledge. I spent about 60 mins for the visit and coordination of his care.    Truitt Merle, MD 02/21/2017  2:05 PM

## 2017-02-21 NOTE — Progress Notes (Signed)
Lab called stating patient Platelet level after first unit was critical "11". On call was notified and another unit of Platelets was transfused. One unit was transfused after 2 RN's reviewed order, platelets, consent and ID. VSS. Patient was educated on how and when to call for assistance and to call for any noted changes during the transfusion, especially within the first 30 minutes. Will continue to monitor.

## 2017-02-21 NOTE — Discharge Summary (Signed)
Physician Discharge Summary  Guy Walker KHT:[id number] DOB: 24-Apr-1961 DOA: 02/20/2017  PCP: Scot Jun, FNP  Admit date: 02/20/2017 Discharge date: 02/21/2017  Recommendations for Outpatient Follow-up:  1. Pt will be transferred to [facility] for further evaluation and management   Discharge Diagnoses:  Principal Problem:   Pancytopenia ([place]) Active Problems:   Pulmonary emboli (HCC)   Thrombocytopenia ([place])  Discharge Condition: Stable  Diet recommendation: Heart healthy diet discussed in details   History of present illness:  Pt is 56 yo male who was recently diagnosed with pulmonary embolism in May 2018 and was started on Xarelto. Also at that time, imaging studies done, raised suspicion for vertebral lesion though to be due to multiple myeloma or other malignancy. He was scheduled for bone marrow biopsy in an outpatient setting. This was done 6/5 and he was found to be pancytopenia and therefore admitted for further evaluation.   Assessment & Plan:   Principal Problem:   Pancytopenia ([place]) - Preliminary bone marrow aspirate dry, showed B-cell clonal blasts, ? 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 - has been transfused 1 U Plt and Plt count is trending up from <5 --> 19K - WBC remains ~ 2.5 - Hg trending down from 9.9 --> 8.8 - appreciate oncologist assistance, continue to monitor blood counts - AC on hold until blood counts stabilize  - bone marrow biopsy still pending   Active Problems:   Pulmonary emboli (HCC) - AC has been on hold due to high risk of bleeding from severe pancytopenia  Severe bone pain, narcotic induced constipation - Continue bowel regimen - Allow analgesia as needed    Essential hypertension - Reasonable inpatient control so far - Continue Norvasc  DVT prophylaxis:  SCDs  Code Status:  full  Family Communication: Patient at bedside,  discussed current available biopsy report as well as blood work with daughter over the phone. I  have also met with daughter in person later in the morning.  Disposition Plan: transfer to [facility]   Consultants:   Oncology   Procedures:   Bone marrow biopsy 02/20/2017   Antimicrobials:   None   Procedures/Studies: Dg Chest 2 View  Result Date: 02/02/2017 CLINICAL DATA:  56 year old male with chest pain and cough for 2 days. EXAM: CHEST  2 VIEW COMPARISON:  04/25/2009 chest CT FINDINGS: This is a low volume film. Streaky and ill-defined opacity within the left lower lobe may represent pneumonia versus atelectasis. A small right pleural effusion is noted. Cardiomediastinal silhouette is unchanged. Minimal right basilar atelectasis is noted. There is no evidence of pneumothorax or acute bony abnormality. IMPRESSION: Streaky and ill-defined left lower lobe opacity which may represent pneumonia versus atelectasis. Small right pleural effusion. Electronically Signed   By: Margarette Canada M.D.   On: 02/02/2017 13:23   Ct Angio Chest Pe W/cm &/or Wo Cm  Result Date: 02/03/2017 CLINICAL DATA:  Followup CTA in 24 hours may be helpful to confirm the right lower lobe emboli.Chest pain and SOB. EXAM: CT ANGIOGRAPHY CHEST WITH CONTRAST TECHNIQUE: Multidetector CT imaging of the chest was performed using the standard protocol during bolus administration of intravenous contrast. Multiplanar CT image reconstructions and MIPs were obtained to evaluate the vascular anatomy. CONTRAST:  100 mL of Isovue 370 intravenous contrast COMPARISON:  CTA, 02/02/2017 FINDINGS: Cardiovascular: Pulmonary emboli to the right lower lobe posterior basilar segment and lateral segment is unchanged from the prior CT. There is no other convincing evidence of pulmonary emboli. The pulmonary arteries were satisfactory opacified.  Study was somewhat degraded by motion. Heart is normal in size. No coronary artery calcifications. Thoracic aorta is normal caliber with no dissection. Mediastinum/Nodes: There are prominent shotty  mediastinal lymph nodes. Largest measures 14 mm in short axis, precarinal. These are stable from the prior CT. No mediastinal or hilar masses. Lungs/Pleura: There is patchy opacity in the right lower lobe at the lung base. This may all be atelectasis. Pneumonia should be considered if there are consistent clinical symptoms. There is a small associated pleural effusion. The appearance is stable from the prior study. Mild subsegmental atelectasis is noted at the lateral left lung base and in the anterior lung bases. Remainder of the lungs is clear. No pneumothorax. No left pleural effusion. Upper Abdomen: Small gallstones.  No acute findings. Musculoskeletal: There are small areas of lucency within multiple vertebra, the largest in the right posterior aspect of T5. These are nonspecific. These are new when compared to a CT of the chest dated 04/25/2009. Consider lytic metastatic disease or multiple myeloma in the differential. Review of the MIP images confirms the above findings. IMPRESSION: 1. Current exam confirms segmental pulmonary emboli to the right lower lobe associated with right lower lobe consolidation and a small effusion. Right lower lobe opacity may reflect combination of atelectasis and infarction. Pneumonia should be considered if there are consistent clinical symptoms. 2. Mild shotty mediastinal adenopathy is stable from the prior exam. 3. Multiple small lucent vertebral bone lesions. These are concerning for lytic metastatic disease or multiple myeloma. Electronically Signed   By: Amie Portland M.D.   On: 02/03/2017 15:25   Ct Angio Chest Pe W/cm &/or Wo Cm  Result Date: 02/02/2017 CLINICAL DATA:  Persistent right rib and shoulder pain beginning 3 days ago after episode of choking while eating. Pain worse with deep inspiration, coughing and sneezing. Mild hemoptysis. EXAM: CT ANGIOGRAPHY CHEST WITH CONTRAST TECHNIQUE: Multidetector CT imaging of the chest was performed using the standard protocol  during bolus administration of intravenous contrast. Multiplanar CT image reconstructions and MIPs were obtained to evaluate the vascular anatomy. CONTRAST:  100 mL Isovue 370 IV. COMPARISON:  CT 04/25/2009 FINDINGS: Cardiovascular: Heart is normal in size. Thoracic aorta is within normal. Pulmonary arterial system is well opacified no evidence of left-sided pulmonary emboli. Findings suggest small emboli over the right lower lobe pulmonary artery lateral basilar segment. RV/LV ratio is 43.2/ 50.8 = 0.83. Mediastinum/Nodes: 1 cm right peritracheal lymph node likely reactive. Possible 1.1 cm precarinal and subcarinal lymph nodes likely reactive. Adenopathy. Remaining mediastinal structures are unremarkable. Lungs/Pleura: Lungs are adequately inflated with heterogeneous airspace opacification over the right lower lobe with small amount right pleural fluid. Minimal linear atelectasis over the right middle lobe and lingula. Airways are within normal. Upper Abdomen: Minimal cholelithiasis. Musculoskeletal: Unremarkable. Review of the MIP images confirms the above findings. IMPRESSION: Findings suggest small emboli over the right lower lobe pulmonary artery. Heterogeneous airspace opacification over the right lower lobe with small right pleural effusion. Findings may be due to atelectasis and infarction, although cannot exclude pneumonia. Mild mediastinal adenopathy likely reactive. Followup CTA in 24 hours may be helpful to confirm the right lower lobe emboli. Minimal cholelithiasis. Critical Value/emergent results were called by telephone at the time of interpretation on 02/02/2017 at 3:57 pm to Dr. Candie Mile , who verbally acknowledged these results. Electronically Signed   By: Elberta Fortis M.D.   On: 02/02/2017 15:47   Ct Abdomen Pelvis W Contrast  Result Date: 02/04/2017 CLINICAL DATA:  Acute  onset of right chest and shoulder pain. Recently diagnosed with pulmonary emboli, and question of multiple myeloma.  Initial encounter. EXAM: CT ABDOMEN AND PELVIS WITH CONTRAST TECHNIQUE: Multidetector CT imaging of the abdomen and pelvis was performed using the standard protocol following bolus administration of intravenous contrast. CONTRAST:  113m ISOVUE-300 IOPAMIDOL (ISOVUE-300) INJECTION 61% COMPARISON:  CTA of the chest performed 02/03/2017 FINDINGS: Lower chest: A trace right-sided pleural effusion is noted. Right basilar airspace opacity reflects the patient's previously diagnosed right-sided pulmonary emboli. Hepatobiliary: The liver is unremarkable in appearance. Stones are noted dependently within the gallbladder. The gallbladder is otherwise unremarkable. The common bile duct remains normal in caliber. Pancreas: The pancreas is within normal limits. Spleen: The spleen is unremarkable in appearance. Adrenals/Urinary Tract: The adrenal glands are unremarkable in appearance. A small right renal cyst is noted. There is no evidence of hydronephrosis. No renal or ureteral stones are identified. No perinephric stranding is seen. Stomach/Bowel: The stomach is unremarkable in appearance. The small bowel is within normal limits. The appendix is normal in caliber, without evidence of appendicitis. The colon is unremarkable in appearance. Vascular/Lymphatic: The abdominal aorta is unremarkable in appearance. The inferior vena cava is grossly unremarkable. No retroperitoneal lymphadenopathy is seen. No pelvic sidewall lymphadenopathy is identified. Reproductive: The bladder is mildly distended and grossly unremarkable. The prostate remains normal in size. Other: No additional soft tissue abnormalities are seen. Musculoskeletal: Scattered lucencies along the lower thoracic and lumbar spine may reflect multiple myeloma or possibly metastatic disease. There is chronic mild loss of height involving the superior endplate of TJ82 Vacuum phenomenon is noted at L4-L5, with associated sclerosis. The visualized musculature is unremarkable  in appearance. IMPRESSION: 1. Scattered lucencies along the lower thoracic and lumbar spine may reflect multiple myeloma or possibly metastatic disease. Chronic mild loss of height involving the superior endplate of TN05 2. Small right renal cyst. 3. Cholelithiasis.  Gallbladder otherwise unremarkable. 4. Trace right-sided pleural effusion. Right basilar airspace opacity reflects the previously diagnosed right-sided pulmonary emboli. Electronically Signed   By: JGarald BaldingM.D.   On: 02/04/2017 18:38   Ct Biopsy  Result Date: 02/20/2017 INDICATION: Concern for multiple myeloma. Please perform CT-guided bone marrow biopsy for tissue diagnostic purposes. EXAM: CT-GUIDED BONE MARROW BIOPSY AND ASPIRATION MEDICATIONS: None ANESTHESIA/SEDATION: Fentanyl 100 mcg IV; Versed 4 mg IV Sedation Time: 14 minutes; The patient was continuously monitored during the procedure by the interventional radiology nurse under my direct supervision. COMPLICATIONS: None immediate. PROCEDURE: Informed consent was obtained from the patient following an explanation of the procedure, risks, benefits and alternatives. The patient understands, agrees and consents for the procedure. All questions were addressed. A time out was performed prior to the initiation of the procedure. The patient was positioned prone and non-contrast localization CT was performed of the pelvis to demonstrate the iliac marrow spaces. The operative site was prepped and draped in the usual sterile fashion. Under sterile conditions and local anesthesia, a 22 gauge spinal needle was utilized for procedural planning. Next, an 11 gauge coaxial bone biopsy needle was advanced into the left iliac marrow space. Needle position was confirmed with CT imaging. Initially, bone marrow aspiration was performed. Next, a bone marrow biopsy was obtained with the 11 gauge outer bone marrow device. The 11 gauge coaxial bone biopsy needle was re-advanced into a slightly different  location within the left iliac marrow space, positioning was confirmed and an additional bone marrow biopsy was obtained. Samples were prepared with the cytotechnologist and deemed adequate.  The needle was removed intact. Hemostasis was obtained with compression and a dressing was placed. The patient tolerated the procedure well without immediate post procedural complication. IMPRESSION: Successful CT guided left iliac bone marrow aspiration and core biopsy. Electronically Signed   By: Sandi Mariscal M.D.   On: 02/20/2017 11:19   Dg Bone Survey Met  Result Date: 02/04/2017 CLINICAL DATA:  Known lucencies of the thoracic spine EXAM: METASTATIC BONE SURVEY COMPARISON:  02/03/2017 FINDINGS: Metastatic bone survey was performed. Skull: Small lucency is noted in the parietal bone on the lateral film of the skull. Cervical spine: Degenerative changes without acute abnormality. Bilateral shoulders: Degenerative changes of the right acromioclavicular joint is noted. No definite lucencies are seen. Bilateral humeri and forearms:  No active lesions are noted. Chest: The lesions are seen. Bibasilar changes are noted right greater than left similar to that seen recent CT examination. Thoracic spine: Pedicles are within normal limits. Vertebral body height is well maintained. The lucency seen on recent CT are not well appreciated on this exam. Lumbar spine: Degenerative changes are noted phase vertebral body height is well maintained. No lucencies are identified. Bilateral femurs and tib-fib:  No definitive lucencies are noted. Pelvis:  No definitive lytic lesions noted. IMPRESSION: Single lucency within the parietal bone on the lateral film of the skull. No other lucencies are identified. Electronically Signed   By: Inez Catalina M.D.   On: 02/04/2017 18:24   Ct Bone Marrow Biopsy  Result Date: 02/20/2017 INDICATION: Concern for multiple myeloma. Please perform CT-guided bone marrow biopsy for tissue diagnostic purposes.  EXAM: CT-GUIDED BONE MARROW BIOPSY AND ASPIRATION MEDICATIONS: None ANESTHESIA/SEDATION: Fentanyl 100 mcg IV; Versed 4 mg IV Sedation Time: 14 minutes; The patient was continuously monitored during the procedure by the interventional radiology nurse under my direct supervision. COMPLICATIONS: None immediate. PROCEDURE: Informed consent was obtained from the patient following an explanation of the procedure, risks, benefits and alternatives. The patient understands, agrees and consents for the procedure. All questions were addressed. A time out was performed prior to the initiation of the procedure. The patient was positioned prone and non-contrast localization CT was performed of the pelvis to demonstrate the iliac marrow spaces. The operative site was prepped and draped in the usual sterile fashion. Under sterile conditions and local anesthesia, a 22 gauge spinal needle was utilized for procedural planning. Next, an 11 gauge coaxial bone biopsy needle was advanced into the left iliac marrow space. Needle position was confirmed with CT imaging. Initially, bone marrow aspiration was performed. Next, a bone marrow biopsy was obtained with the 11 gauge outer bone marrow device. The 11 gauge coaxial bone biopsy needle was re-advanced into a slightly different location within the left iliac marrow space, positioning was confirmed and an additional bone marrow biopsy was obtained. Samples were prepared with the cytotechnologist and deemed adequate. The needle was removed intact. Hemostasis was obtained with compression and a dressing was placed. The patient tolerated the procedure well without immediate post procedural complication. IMPRESSION: Successful CT guided left iliac bone marrow aspiration and core biopsy. Electronically Signed   By: Sandi Mariscal M.D.   On: 02/20/2017 11:19     Discharge Exam: Vitals:   02/21/17 0015 02/21/17 0511  BP: (!) 138/92 123/73  Pulse: 84 66  Resp: 18 17  Temp: 98.6 F (37 C)  98.2 F (36.8 C)   Vitals:   02/20/17 2300 02/20/17 2315 02/21/17 0015 02/21/17 0511  BP: (!) 154/84 (!) 147/78 (!) 138/92 123/73  Pulse: 79 79 84 66  Resp: 18 18 18 17   Temp: 98.3 F (36.8 C) 98 F (36.7 C) 98.6 F (37 C) 98.2 F (36.8 C)  TempSrc: Oral Oral Oral Oral  SpO2: 97% 97%  98%  Weight:      Height:        General: Pt is alert, follows commands appropriately, not in acute distress Cardiovascular: Regular rate and rhythm, S1/S2 +, no murmurs, no rubs, no gallops Respiratory: Clear to auscultation bilaterally, no wheezing, no crackles, no rhonchi Abdominal: Soft, non tender, non distended, bowel sounds +, no guarding Extremities: no edema, no cyanosis, pulses palpable bilaterally DP and PT Neuro: Grossly nonfocal  Discharge Instructions  Discharge Instructions    Diet - low sodium heart healthy    Complete by:  As directed    Increase activity slowly    Complete by:  As directed      Allergies as of 02/21/2017   No Known Allergies     Medication List    STOP taking these medications   HYDROcodone-acetaminophen 5-325 MG tablet Commonly known as:  NORCO     TAKE these medications   amLODipine 5 MG tablet Commonly known as:  NORVASC Take 1 tablet (5 mg total) by mouth daily.   docusate sodium 100 MG capsule Commonly known as:  COLACE Take 1 capsule (100 mg total) by mouth every 12 (twelve) hours.   oxyCODONE-acetaminophen 5-325 MG tablet Commonly known as:  PERCOCET Take 1-2 tablets by mouth every 6 (six) hours as needed for moderate pain or severe pain. Do not refill after 03/03/2017   polyethylene glycol packet Commonly known as:  MIRALAX / GLYCOLAX Take 17 g by mouth daily.         The results of significant diagnostics from this hospitalization (including imaging, microbiology, ancillary and laboratory) are listed below for reference.     Microbiology: No results found for this or any previous visit (from the past 240 hour(s)).    Labs: Basic Metabolic Panel:  Recent Labs Lab 02/20/17 1230 02/21/17 0422  NA 135 137  K 3.9 4.3  CL 103 103  CO2 25 27  GLUCOSE 111* 113*  BUN 14 16  CREATININE 0.65 0.61  CALCIUM 8.3* 8.4*   Liver Function Tests:  Recent Labs Lab 02/20/17 1230  AST 43*  ALT 29  ALKPHOS 146*  BILITOT 0.4  PROT 6.3*  ALBUMIN 3.1*   CBC:  Recent Labs Lab 02/20/17 0716 02/20/17 0839 02/20/17 1918 02/21/17 0422  WBC 2.0* 1.8* 2.4* 2.5*  NEUTROABS  --  0.5*  --   --   HGB 10.2* 9.7* 9.9* 8.8*  HCT 28.5* 27.5* 27.6* 24.5*  MCV 83.3 82.8 83.6 83.9  PLT <5* <5*  <5* 11* 16*  19*   BNP (last 3 results)  Recent Labs  02/02/17 1414  BNP 36.0    SIGNED: Time coordinating discharge: Over 30 minutes  Faye Ramsay, MD  [facility] 02/21/2017, 11:22 AM Pager [phone]  If 7PM-7AM, please contact night-coverage [email or web address] Password [email or web address]

## 2017-02-26 ENCOUNTER — Inpatient Hospital Stay: Payer: Self-pay | Admitting: Hematology

## 2017-03-01 ENCOUNTER — Inpatient Hospital Stay: Payer: Self-pay | Admitting: Hematology

## 2017-03-12 ENCOUNTER — Ambulatory Visit: Payer: Self-pay | Admitting: Family Medicine

## 2017-03-14 ENCOUNTER — Encounter (HOSPITAL_COMMUNITY): Payer: Self-pay

## 2017-03-23 LAB — CHROMOSOME ANALYSIS, BONE MARROW

## 2017-03-23 LAB — TISSUE HYBRIDIZATION (BONE MARROW)-NCBH

## 2018-11-19 IMAGING — CT CT ANGIO CHEST
2 of 6 series · 18 of 36 positions shown · IV contrast (ISOVUE 370)
Comparison: CT 04/25/2009

CLINICAL DATA: Persistent right rib and shoulder pain beginning 3
days ago after episode of choking while eating. Pain worse with deep
inspiration, coughing and sneezing. Mild hemoptysis.

EXAM:
CT ANGIOGRAPHY CHEST WITH CONTRAST
TECHNIQUE: Multidetector CT imaging of the chest was performed using the
standard protocol during bolus administration of intravenous
contrast. Multiplanar CT image reconstructions and MIPs were
obtained to evaluate the vascular anatomy.
CONTRAST:  100 mL Isovue 370 IV.

[Series 7: thins for pacs · axial · 0.74mm/px · z∈[-274,-37]mm · 17 of 265 slices shown]
[im 14/265  lung]
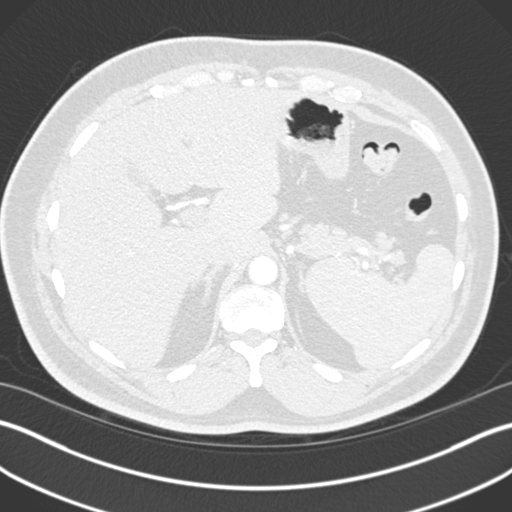
[im 27/265  mediastinal]
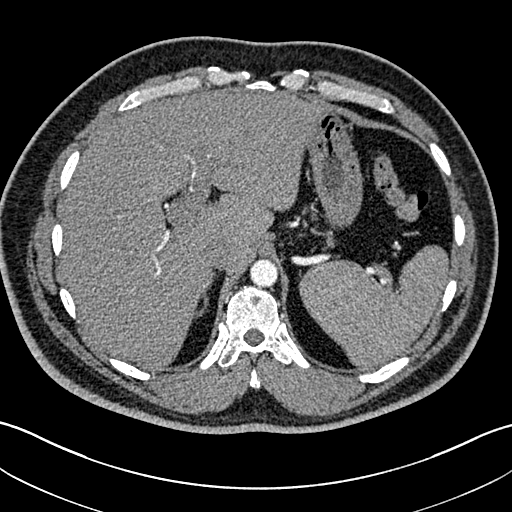
[im 40/265  lung]
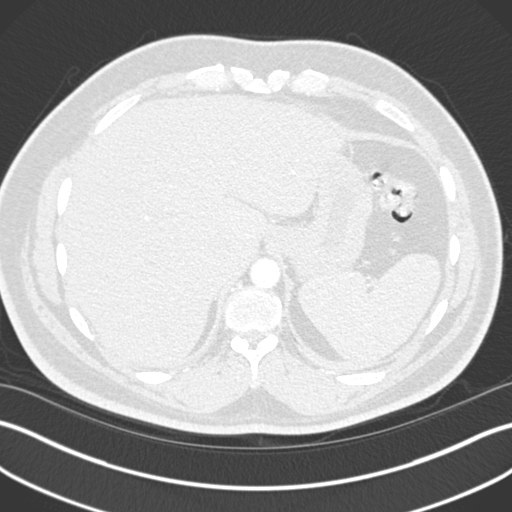
[im 53/265  mediastinal]
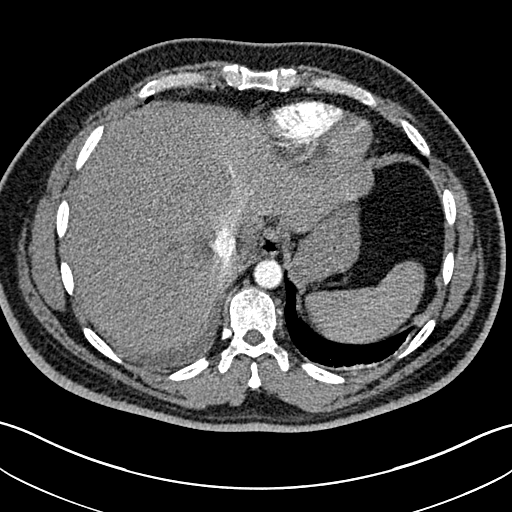
[im 80/265  lung]
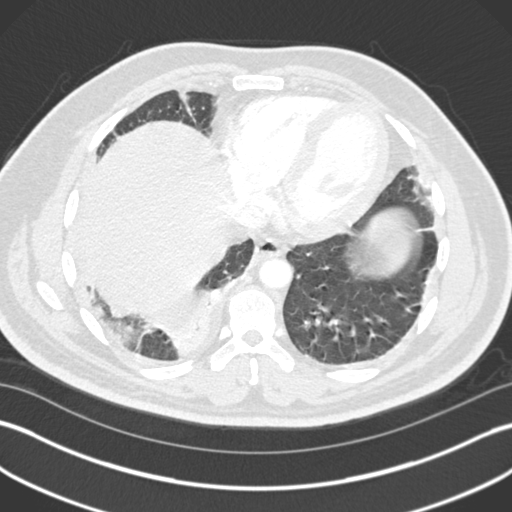
[im 93/265  mediastinal]
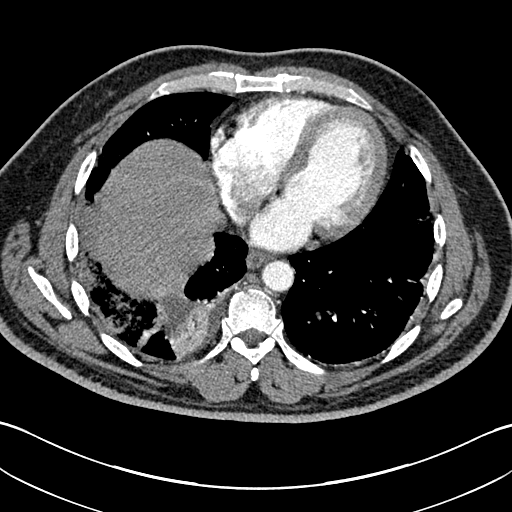
[im 106/265  lung]
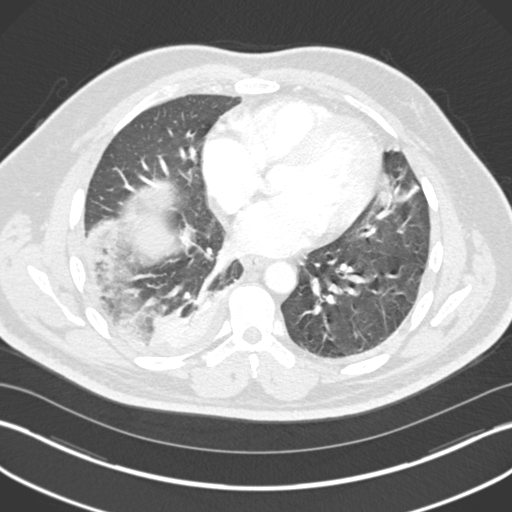
[im 119/265  mediastinal]
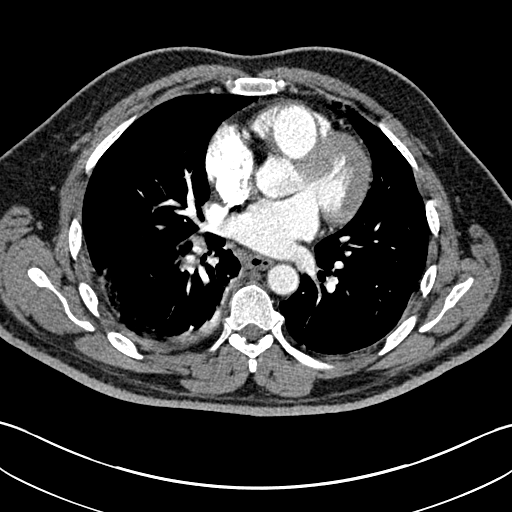
[im 133/265  lung]
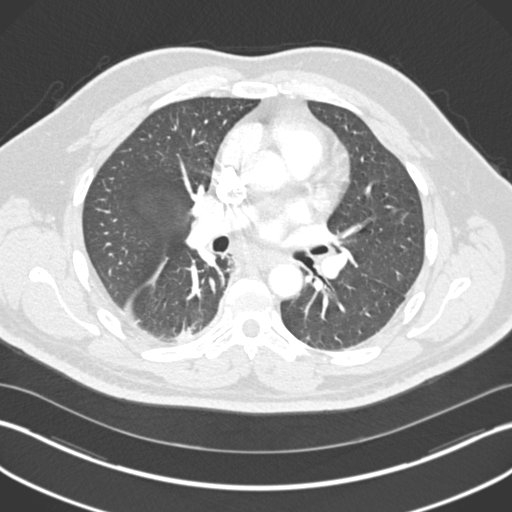
[im 146/265  mediastinal]
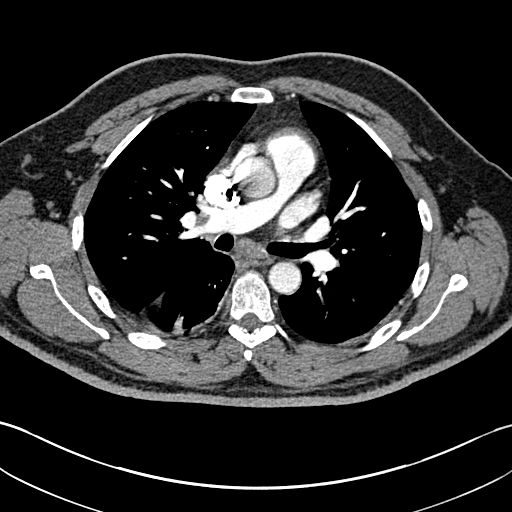
[im 159/265  lung]
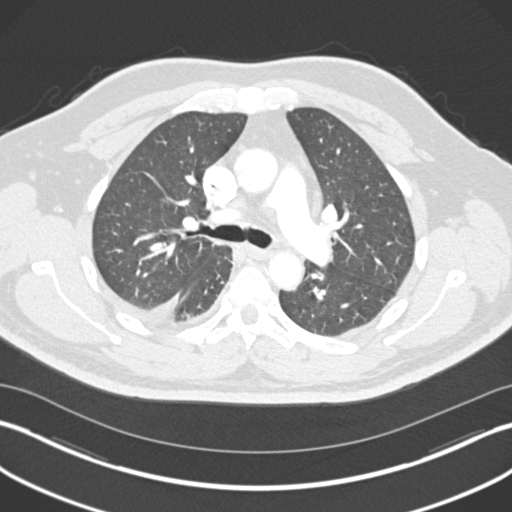
[im 172/265  mediastinal]
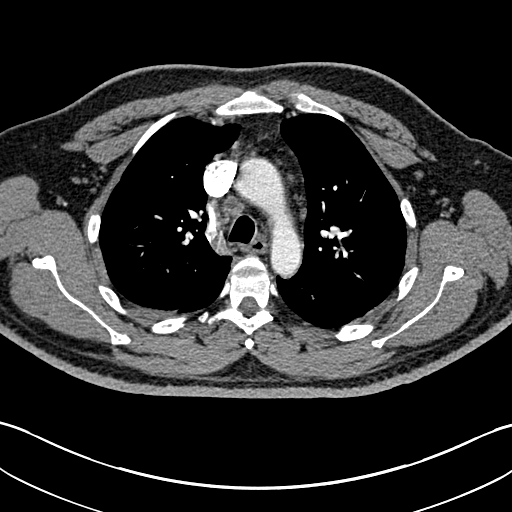
[im 185/265  lung]
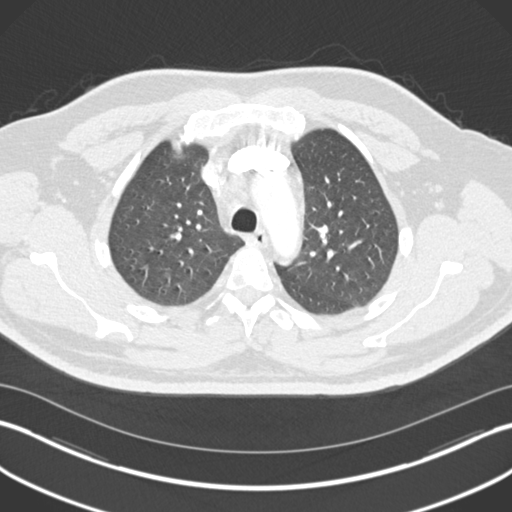
[im 212/265  mediastinal]
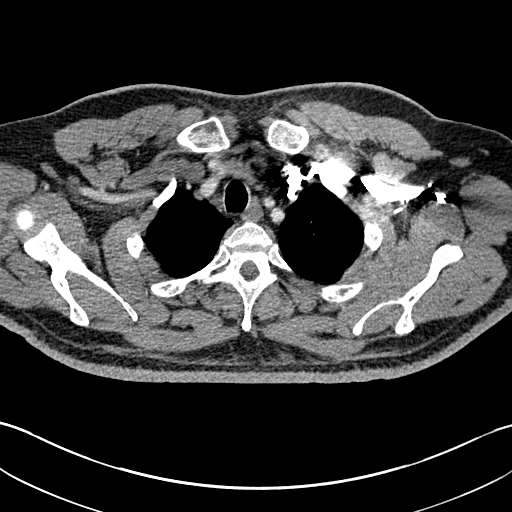
[im 225/265  lung]
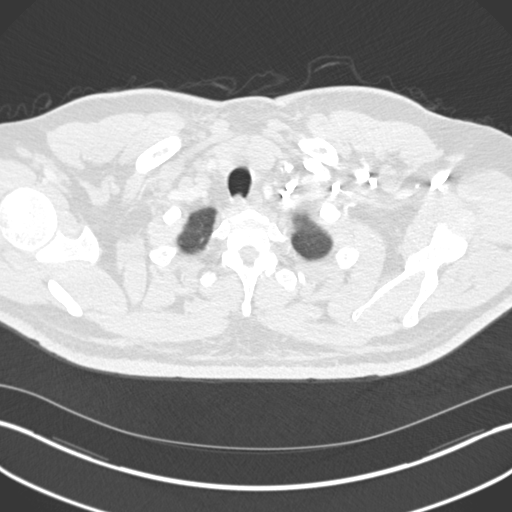
[im 238/265  mediastinal]
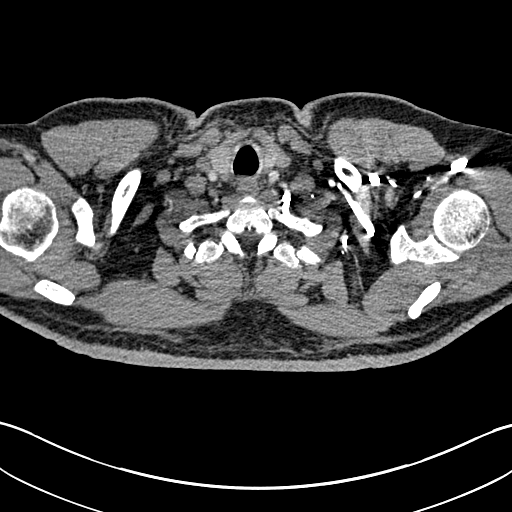
[im 251/265  lung]
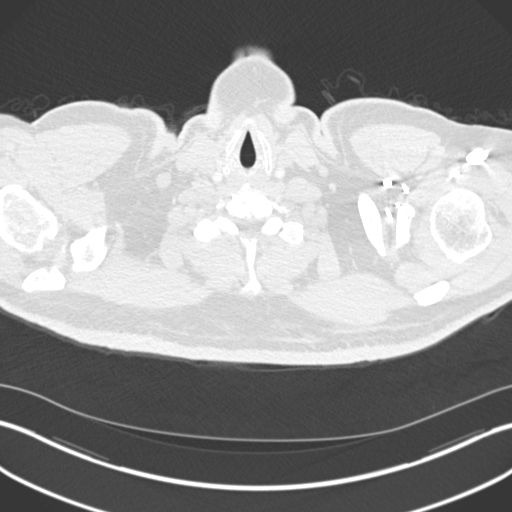

[Series 9: coronal mpr · coronal · 0.52mm/px · 1 of 165 slices shown]
[im 83/165  mediastinal]
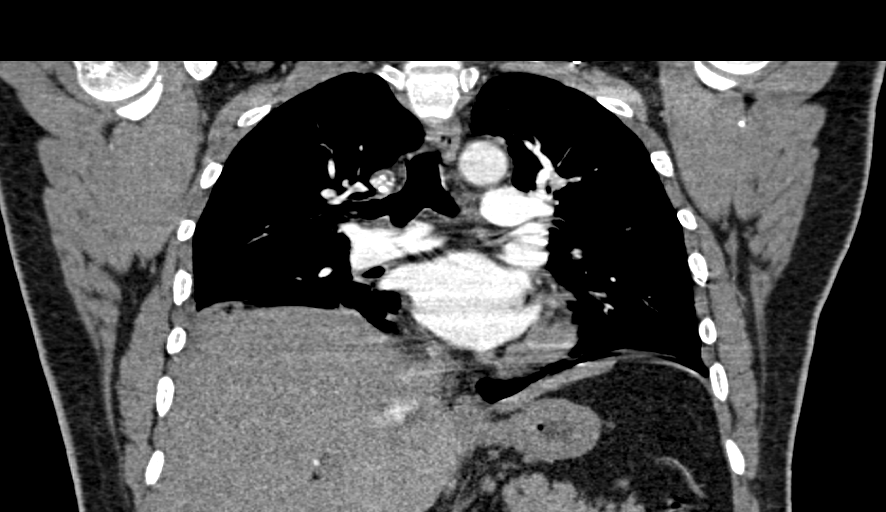

[18 of 36 positions shown; findings below may reference images not displayed]

FINDINGS: Cardiovascular: Heart is normal in size. Thoracic aorta is within
normal. Pulmonary arterial system is well opacified no evidence of
left-sided pulmonary emboli. Findings suggest small emboli over the
right lower lobe pulmonary artery lateral basilar segment. RV/LV
ratio is 43.2/ 50.8 = 0.83.

Mediastinum/Nodes: 1 cm right peritracheal lymph node likely
reactive. Possible 1.1 cm precarinal and subcarinal lymph nodes
likely reactive. Adenopathy. Remaining mediastinal structures are
unremarkable.

Lungs/Pleura: Lungs are adequately inflated with heterogeneous
airspace opacification over the right lower lobe with small amount
right pleural fluid. Minimal linear atelectasis over the right
middle lobe and lingula. Airways are within normal.

Upper Abdomen: Minimal cholelithiasis.

Musculoskeletal: Unremarkable.

Review of the MIP images confirms the above findings.
IMPRESSION: Findings suggest small emboli over the right lower lobe pulmonary
artery. Heterogeneous airspace opacification over the right lower
lobe with small right pleural effusion. Findings may be due to
atelectasis and infarction, although cannot exclude pneumonia. Mild
mediastinal adenopathy likely reactive. Followup CTA in 24 hours may
be helpful to confirm the right lower lobe emboli.

Minimal cholelithiasis.

Critical Value/emergent results were called by telephone at the time
of interpretation on 02/02/2017 at [DATE] to Dr. WILNER SHIMIZU ,
who verbally acknowledged these results.

## 2019-02-23 ENCOUNTER — Emergency Department (HOSPITAL_COMMUNITY)
Admission: EM | Admit: 2019-02-23 | Discharge: 2019-02-24 | Disposition: A | Discharge status: Home / Self Care | Payer: Self-pay | Attending: Emergency Medicine | Admitting: Emergency Medicine

## 2019-02-23 DIAGNOSIS — R52 Pain, unspecified: Secondary | ICD-10-CM | POA: Insufficient documentation

## 2019-02-23 DIAGNOSIS — Z5321 Procedure and treatment not carried out due to patient leaving prior to being seen by health care provider: Secondary | ICD-10-CM | POA: Insufficient documentation

## 2019-02-23 MED ORDER — NALOXONE HCL 2 MG/2ML IJ SOSY
PREFILLED_SYRINGE | INTRAMUSCULAR | Status: AC
  Filled 2019-02-23: qty 2

## 2019-02-24 NOTE — ED Notes (Signed)
Pt states that he is no longer having any pain at this time and does not want to be seen by the physician.
